# Patient Record
Sex: Female | Born: 1990 | Race: White | Hispanic: No | Marital: Single | Smoking: Former smoker
Health system: Southern US, Community
[De-identification: ages and names within clinical notes are randomized; demographics above are authoritative.]

## PROBLEM LIST (undated history)

## (undated) DIAGNOSIS — C801 Malignant (primary) neoplasm, unspecified: Secondary | ICD-10-CM

## (undated) DIAGNOSIS — E559 Vitamin D deficiency, unspecified: Secondary | ICD-10-CM

## (undated) DIAGNOSIS — I10 Essential (primary) hypertension: Secondary | ICD-10-CM

## (undated) DIAGNOSIS — M797 Fibromyalgia: Secondary | ICD-10-CM

## (undated) DIAGNOSIS — G43909 Migraine, unspecified, not intractable, without status migrainosus: Secondary | ICD-10-CM

## (undated) DIAGNOSIS — F419 Anxiety disorder, unspecified: Secondary | ICD-10-CM

## (undated) DIAGNOSIS — IMO0002 Reserved for concepts with insufficient information to code with codable children: Secondary | ICD-10-CM

## (undated) HISTORY — PX: APPENDECTOMY: SHX54

## (undated) HISTORY — PX: CHOLECYSTECTOMY: SHX55

---

## 2013-11-19 ENCOUNTER — Encounter (HOSPITAL_COMMUNITY): Payer: Self-pay | Admitting: Emergency Medicine

## 2013-11-19 ENCOUNTER — Emergency Department (HOSPITAL_COMMUNITY)
Admission: EM | Admit: 2013-11-19 | Discharge: 2013-11-20 | Disposition: A | Discharge status: Home / Self Care | Payer: Self-pay | Attending: Emergency Medicine | Admitting: Emergency Medicine

## 2013-11-19 DIAGNOSIS — Z3202 Encounter for pregnancy test, result negative: Secondary | ICD-10-CM | POA: Insufficient documentation

## 2013-11-19 DIAGNOSIS — R102 Pelvic and perineal pain: Secondary | ICD-10-CM

## 2013-11-19 DIAGNOSIS — N949 Unspecified condition associated with female genital organs and menstrual cycle: Secondary | ICD-10-CM | POA: Insufficient documentation

## 2013-11-19 DIAGNOSIS — Z880 Allergy status to penicillin: Secondary | ICD-10-CM | POA: Insufficient documentation

## 2013-11-19 DIAGNOSIS — F17200 Nicotine dependence, unspecified, uncomplicated: Secondary | ICD-10-CM | POA: Insufficient documentation

## 2013-11-19 DIAGNOSIS — Z8639 Personal history of other endocrine, nutritional and metabolic disease: Secondary | ICD-10-CM | POA: Insufficient documentation

## 2013-11-19 DIAGNOSIS — Z862 Personal history of diseases of the blood and blood-forming organs and certain disorders involving the immune mechanism: Secondary | ICD-10-CM | POA: Insufficient documentation

## 2013-11-19 DIAGNOSIS — R109 Unspecified abdominal pain: Secondary | ICD-10-CM | POA: Insufficient documentation

## 2013-11-19 DIAGNOSIS — Z8739 Personal history of other diseases of the musculoskeletal system and connective tissue: Secondary | ICD-10-CM | POA: Insufficient documentation

## 2013-11-19 DIAGNOSIS — N898 Other specified noninflammatory disorders of vagina: Secondary | ICD-10-CM | POA: Insufficient documentation

## 2013-11-19 HISTORY — DX: Reserved for concepts with insufficient information to code with codable children: IMO0002

## 2013-11-19 HISTORY — DX: Vitamin D deficiency, unspecified: E55.9

## 2013-11-19 HISTORY — DX: Fibromyalgia: M79.7

## 2013-11-19 LAB — WET PREP, GENITAL
CLUE CELLS WET PREP: NONE SEEN
Trich, Wet Prep: NONE SEEN
Yeast Wet Prep HPF POC: NONE SEEN

## 2013-11-19 LAB — URINALYSIS, ROUTINE W REFLEX MICROSCOPIC
BILIRUBIN URINE: NEGATIVE
Glucose, UA: NEGATIVE mg/dL
Hgb urine dipstick: NEGATIVE
KETONES UR: NEGATIVE mg/dL
LEUKOCYTES UA: NEGATIVE
Nitrite: NEGATIVE
Protein, ur: NEGATIVE mg/dL
Specific Gravity, Urine: 1.02 (ref 1.005–1.030)
UROBILINOGEN UA: 0.2 mg/dL (ref 0.0–1.0)
pH: 5.5 (ref 5.0–8.0)

## 2013-11-19 LAB — POCT PREGNANCY, URINE: Preg Test, Ur: NEGATIVE

## 2013-11-19 MED ORDER — FENTANYL CITRATE 0.05 MG/ML IJ SOLN: 50.0000 ug | Freq: Once | INTRAMUSCULAR | Status: DC

## 2013-11-19 MED ORDER — FENTANYL CITRATE 0.05 MG/ML IJ SOLN
25.0000 ug | Freq: Once | INTRAMUSCULAR | Status: AC
  Administered 2013-11-19: 25 ug via INTRAMUSCULAR
  Filled 2013-11-19: qty 2

## 2013-11-19 NOTE — ED Provider Notes (Signed)
CSN: [id number]     Arrival date & time 11/19/13  1716 History   First MD Initiated Contact with Patient 11/19/13 2145     Chief Complaint  Patient presents with  . Pelvic Pain   (Consider location/radiation/quality/duration/timing/severity/associated sxs/prior Treatment) Patient is a 23 y.o. female presenting with vaginal discharge. The history is provided by the patient.  Vaginal Discharge Quality:  Milky Severity:  Moderate Onset quality:  Gradual Timing:  Constant Relieved by:  Nothing Associated symptoms: abdominal pain   Associated symptoms: no dyspareunia, no dysuria, no fever, no nausea and no vomiting   Associated symptoms comment:  Pelvic pain   Past Medical History  Diagnosis Date  . Herniated disc   . Fibromyalgia   . Vitamin D deficiency    History reviewed. No pertinent past surgical history. History reviewed. No pertinent family history. History  Substance Use Topics  . Smoking status: Current Every Day Smoker    Types: Cigarettes  . Smokeless tobacco: Not on file  . Alcohol Use: Yes     Comment: occ   OB History   Grav Para Term Preterm Abortions TAB SAB Ect Mult Living                 Review of Systems  Constitutional: Negative for fever and chills.  HENT: Negative for sore throat.   Eyes: Negative for pain.  Respiratory: Negative for cough and shortness of breath.   Cardiovascular: Negative for chest pain.  Gastrointestinal: Positive for abdominal pain. Negative for nausea, vomiting and diarrhea.  Genitourinary: Positive for vaginal discharge and vaginal pain. Negative for dysuria and dyspareunia.  Musculoskeletal: Negative for back pain.  Skin: Negative for rash.  Neurological: Negative for numbness and headaches.    Allergies  Penicillins and Morphine and related  Home Medications   Current Outpatient Rx  Name  Route  Sig  Dispense  Refill  . aspirin-acetaminophen-caffeine (EXCEDRIN MIGRAINE) 250-250-65 MG per tablet   Oral   Take 2  tablets by mouth every 6 (six) hours as needed for headache.         . Pseudoeph-CPM-DM-APAP (TYLENOL COLD) 30-2-15-325 MG TABS   Oral   Take 2 tablets by mouth daily as needed (for cold).         Marland Kitchen HYDROcodone-acetaminophen (NORCO/VICODIN) 5-325 MG per tablet   Oral   Take 1-2 tablets by mouth every 4 (four) hours as needed for moderate pain or severe pain.   15 tablet   0    BP 109/77  Pulse 70  Temp(Src) 98.3 F (36.8 C) (Oral)  Resp 18  SpO2 99%  LMP 11/03/2013 Physical Exam  Constitutional: She is oriented to person, place, and time. She appears well-developed and well-nourished. No distress.  HENT:  Head: Normocephalic and atraumatic.  Eyes: Pupils are equal, round, and reactive to light. Right eye exhibits no discharge. Left eye exhibits no discharge.  Neck: Normal range of motion.  Cardiovascular: Normal rate, regular rhythm and normal heart sounds.   Pulmonary/Chest: Effort normal and breath sounds normal.  Abdominal: Soft. She exhibits no distension. There is no tenderness.  Genitourinary: There is no rash or tenderness on the right labia. There is no rash or tenderness on the left labia. Cervix exhibits friability. Cervix exhibits no motion tenderness and no discharge. Right adnexum displays no mass and no tenderness. Left adnexum displays no mass and no tenderness. There is tenderness around the vagina.  Musculoskeletal: Normal range of motion.  Neurological: She is alert and oriented  to person, place, and time.  Skin: Skin is warm. She is not diaphoretic.   ED Course  Procedures (including critical care time) Labs Review Labs Reviewed  WET PREP, GENITAL - Abnormal; Notable for the following:    WBC, Wet Prep HPF POC MANY (*)    All other components within normal limits  GC/CHLAMYDIA PROBE AMP  URINALYSIS, ROUTINE W REFLEX MICROSCOPIC  POCT PREGNANCY, URINE   Imaging Review No results found.  EKG Interpretation   None       MDM   1. Pelvic pain     23 yo F with pelvic pain for a few months, seen previously for the same. Worsening over the last few weeks.   Upon examination, patient HDS. PE as above, with mild whitish discharge, no CMT tenderness, mild friability. Doubt PID, TOA. Likely discharge with follow-up with OB/GYN for [place]. Not indicated at present time. Will send wet prep, GC/Chlam.   No acute findings on lab work. Patient not pregnant. HDS. Will d/c to home with short course of breakthrough pain medications, with [facility]. Patient voices understanding, and is comfortable with that plan. Strict return precautions given. Patient seen and evaluated by myself and my attending, Dr. Karle Starch.      Freddi Che, MD 11/20/13 [phone]

## 2013-11-19 NOTE — ED Notes (Signed)
Reports having pelvic pain x a few months, has been seen for same. Has been more severe x 1.5 weeks. Having pressure and pain with urination. Reports heavier vaginal discharge than her norm. No acute distress noted at triage.

## 2013-11-20 MED ORDER — HYDROCODONE-ACETAMINOPHEN 5-325 MG PO TABS: 1.0000 | ORAL_TABLET | ORAL | Status: DC | PRN

## 2013-11-20 NOTE — ED Provider Notes (Signed)
I saw and evaluated the patient, reviewed the resident's note and I agree with the findings and plan.  EKG Interpretation   None       Pt with weeks of pelvic pain, not pregnant. Labs reviewed, will need Gyn followup.   Peniel Biel B. Karle Starch, MD 11/20/13 1610

## 2013-11-21 LAB — GC/CHLAMYDIA PROBE AMP
CT Probe RNA: NEGATIVE
GC PROBE AMP APTIMA: NEGATIVE

## 2015-08-07 ENCOUNTER — Encounter (HOSPITAL_COMMUNITY): Payer: Self-pay | Admitting: *Deleted

## 2015-08-07 ENCOUNTER — Emergency Department (HOSPITAL_COMMUNITY): Payer: Medicaid Other

## 2015-08-07 ENCOUNTER — Emergency Department (HOSPITAL_COMMUNITY)
Admission: EM | Admit: 2015-08-07 | Discharge: 2015-08-08 | Disposition: A | Discharge status: Home / Self Care | Payer: Medicaid Other | Attending: Emergency Medicine | Admitting: Emergency Medicine

## 2015-08-07 DIAGNOSIS — N390 Urinary tract infection, site not specified: Secondary | ICD-10-CM | POA: Diagnosis not present

## 2015-08-07 DIAGNOSIS — Z3202 Encounter for pregnancy test, result negative: Secondary | ICD-10-CM | POA: Insufficient documentation

## 2015-08-07 DIAGNOSIS — Z720 Tobacco use: Secondary | ICD-10-CM | POA: Insufficient documentation

## 2015-08-07 DIAGNOSIS — N739 Female pelvic inflammatory disease, unspecified: Secondary | ICD-10-CM | POA: Insufficient documentation

## 2015-08-07 DIAGNOSIS — Z880 Allergy status to penicillin: Secondary | ICD-10-CM | POA: Insufficient documentation

## 2015-08-07 DIAGNOSIS — Z79899 Other long term (current) drug therapy: Secondary | ICD-10-CM | POA: Insufficient documentation

## 2015-08-07 DIAGNOSIS — Z8739 Personal history of other diseases of the musculoskeletal system and connective tissue: Secondary | ICD-10-CM | POA: Insufficient documentation

## 2015-08-07 DIAGNOSIS — A5901 Trichomonal vulvovaginitis: Secondary | ICD-10-CM | POA: Diagnosis not present

## 2015-08-07 DIAGNOSIS — R102 Pelvic and perineal pain unspecified side: Secondary | ICD-10-CM

## 2015-08-07 DIAGNOSIS — N938 Other specified abnormal uterine and vaginal bleeding: Secondary | ICD-10-CM | POA: Diagnosis present

## 2015-08-07 DIAGNOSIS — N730 Acute parametritis and pelvic cellulitis: Secondary | ICD-10-CM

## 2015-08-07 DIAGNOSIS — A599 Trichomoniasis, unspecified: Secondary | ICD-10-CM

## 2015-08-07 LAB — WET PREP, GENITAL: YEAST WET PREP: NONE SEEN

## 2015-08-07 LAB — URINALYSIS, ROUTINE W REFLEX MICROSCOPIC
Bilirubin Urine: NEGATIVE
Glucose, UA: NEGATIVE mg/dL
Hgb urine dipstick: NEGATIVE
KETONES UR: NEGATIVE mg/dL
NITRITE: NEGATIVE
Protein, ur: NEGATIVE mg/dL
Specific Gravity, Urine: 1.026 (ref 1.005–1.030)
Urobilinogen, UA: 0.2 mg/dL (ref 0.0–1.0)
pH: 7 (ref 5.0–8.0)

## 2015-08-07 LAB — I-STAT CHEM 8, ED
BUN: 23 mg/dL — ABNORMAL HIGH (ref 6–20)
CALCIUM ION: 1.17 mmol/L (ref 1.12–1.23)
Chloride: 106 mmol/L (ref 101–111)
Creatinine, Ser: 0.6 mg/dL (ref 0.44–1.00)
Glucose, Bld: 110 mg/dL — ABNORMAL HIGH (ref 65–99)
HCT: 48 % — ABNORMAL HIGH (ref 36.0–46.0)
Hemoglobin: 16.3 g/dL — ABNORMAL HIGH (ref 12.0–15.0)
Potassium: 4.3 mmol/L (ref 3.5–5.1)
SODIUM: 141 mmol/L (ref 135–145)
TCO2: 23 mmol/L (ref 0–100)

## 2015-08-07 LAB — URINE MICROSCOPIC-ADD ON

## 2015-08-07 LAB — POC URINE PREG, ED: PREG TEST UR: NEGATIVE

## 2015-08-07 MED ORDER — SODIUM CHLORIDE 0.9 % IV SOLN
INTRAVENOUS | Status: DC
  Administered 2015-08-07: 21:00:00 via INTRAVENOUS

## 2015-08-07 MED ORDER — AZITHROMYCIN 250 MG PO TABS
1000.0000 mg | ORAL_TABLET | Freq: Once | ORAL | Status: AC
  Administered 2015-08-07: 1000 mg via ORAL
  Filled 2015-08-07: qty 4

## 2015-08-07 MED ORDER — KETOROLAC TROMETHAMINE 30 MG/ML IJ SOLN
30.0000 mg | Freq: Once | INTRAMUSCULAR | Status: AC
  Administered 2015-08-07: 30 mg via INTRAVENOUS
  Filled 2015-08-07: qty 1

## 2015-08-07 MED ORDER — DEXTROSE 5 % IV SOLN
1.0000 g | Freq: Once | INTRAVENOUS | Status: AC
  Administered 2015-08-07: 1 g via INTRAVENOUS
  Filled 2015-08-07: qty 10

## 2015-08-07 MED ORDER — PROMETHAZINE HCL 25 MG/ML IJ SOLN
12.5000 mg | Freq: Once | INTRAMUSCULAR | Status: AC
  Administered 2015-08-07: 12.5 mg via INTRAVENOUS
  Filled 2015-08-07: qty 1

## 2015-08-07 MED ORDER — HYDROMORPHONE HCL 1 MG/ML IJ SOLN
1.0000 mg | Freq: Once | INTRAMUSCULAR | Status: AC
  Administered 2015-08-07: 1 mg via INTRAVENOUS
  Filled 2015-08-07: qty 1

## 2015-08-07 MED ORDER — NAPROXEN 500 MG PO TABS: 500.0000 mg | ORAL_TABLET | Freq: Two times a day (BID) | ORAL | Status: DC

## 2015-08-07 NOTE — ED Notes (Signed)
Patient reports history of cervical cancer, has not begun any treatments due to lack of insurance. Patient reports vaginal bleeding, abdominal pain, and episodes of passing out.

## 2015-08-07 NOTE — ED Notes (Signed)
Pt st's no relief from Toradol.

## 2015-08-07 NOTE — ED Provider Notes (Signed)
CSN: [id number]     Arrival date & time 08/07/15  1752 History  By signing my name below, I, Gina Lawson, attest that this documentation has been prepared under the direction and in the presence of Gina Baller, NP.  Electronically Signed: Tula Lawson, ED Scribe. 08/07/2015. 7:46 PM.   Chief Complaint  Patient presents with  . Vaginal Bleeding   The history is provided by the patient. No language interpreter was used.   HPI Comments: Gina Lawson is a 24 y.o. female G4 P1 with cervical cancer and a history of Vitamin D deficiency and fibromyalgia who presents to the Emergency Department complaining of intermittent, gradually worsening, moderate vaginal bleeding that started several months ago. Pt reports multiple episodes of syncope due to pain, vomiting, abdominal pain and dyspareunia as associated symptoms. She also notes recurrent kidney infections over the last year. Pt was diagnosed with precancerous cervical cells 5 years ago and was being monitored every 6 months prior to losing insurance. She states that she was last seen in the ED 6 months ago and was told her cancerous cells were spreading. Pt is currently waiting for Medicaid in order to pursue cancer treatment. She last had intercourse 3 days ago. Pt denies fever.   Past Medical History  Diagnosis Date  . Herniated disc   . Fibromyalgia   . Vitamin D deficiency    History reviewed. No pertinent past surgical history. History reviewed. No pertinent family history. Social History  Substance Use Topics  . Smoking status: Current Every Day Smoker    Types: Cigarettes  . Smokeless tobacco: None  . Alcohol Use: Yes     Comment: occ   OB History    No data available     Review of Systems  Constitutional: Negative for fever.  Gastrointestinal: Positive for vomiting and abdominal pain.  Genitourinary: Positive for vaginal bleeding.  Neurological: Positive for syncope.  All other systems reviewed and are  negative.     Allergies  Penicillins and Morphine and related  Home Medications   Prior to Admission medications   Medication Sig Start Date End Date Taking? Authorizing Provider  norgestimate-ethinyl estradiol (SPRINTEC 28) 0.25-35 MG-MCG tablet Take 1 tablet by mouth daily. 01/30/14  Yes Historical Provider, MD  metroNIDAZOLE (FLAGYL) 500 MG tablet Take 1 tablet (500 mg total) by mouth 2 (two) times daily. 08/08/15   Hope Bunnie Pion, NP  naproxen (NAPROSYN) 500 MG tablet Take 1 tablet (500 mg total) by mouth 2 (two) times daily. 08/07/15   Hope Bunnie Pion, NP  ondansetron (ZOFRAN) 4 MG tablet Take 1 tablet (4 mg total) by mouth every 6 (six) hours. 08/08/15   Hope Bunnie Pion, NP   BP 102/51 mmHg  Pulse 64  Temp(Src) 98.1 F (36.7 C) (Oral)  Resp 16  SpO2 99% Physical Exam  Constitutional: She is oriented to person, place, and time. She appears well-developed and well-nourished.  HENT:  Head: Normocephalic and atraumatic.  Eyes: EOM are normal.  Neck: Normal range of motion. Neck supple.  Cardiovascular: Normal rate.   Pulmonary/Chest: Effort normal.  Abdominal: Soft. There is tenderness in the right lower quadrant, suprapubic area and left lower quadrant. There is no rebound, no guarding and no CVA tenderness.  Genitourinary:  External genitalia without lesions, frothy d/c vaginal vault Cervix without lesions, positive CMT, bilateral adnexal tenderness, uterus without palpable enlargement.   Musculoskeletal: Normal range of motion.  Neurological: She is alert and oriented to person, place, and time. She has normal  strength. No cranial nerve deficit. Gait normal.  Skin: Skin is warm and dry.  Psychiatric: She has a normal mood and affect. Her behavior is normal.  Nursing note and vitals reviewed.   ED Course  Procedures   DIAGNOSTIC STUDIES: Oxygen Saturation is 97% on RA, normal by my interpretation.    COORDINATION OF CARE: 7:59 PM Discussed treatment plan with pt which  includes IV fluids, pain and nausea management, US Pelvis and a pelvic exam. Pt agreed to plan.  Consult with Dr. Elly Modena, OB/GYN at [facility] and patient can follow up there as needed.   Labs Review Results for orders placed or performed during the hospital encounter of 08/07/15 (from the past 24 hour(s))  I-Stat Chem 8, ED     Status: Abnormal   Collection Time: 08/07/15  6:30 PM  Result Value Ref Range   Sodium 141 135 - 145 mmol/L   Potassium 4.3 3.5 - 5.1 mmol/L   Chloride 106 101 - 111 mmol/L   BUN 23 (H) 6 - 20 mg/dL   Creatinine, Ser 0.60 0.44 - 1.00 mg/dL   Glucose, Bld 110 (H) 65 - 99 mg/dL   Calcium, Ion 1.17 1.12 - 1.23 mmol/L   TCO2 23 0 - 100 mmol/L   Hemoglobin 16.3 (H) 12.0 - 15.0 g/dL   HCT 48.0 (H) 36.0 - 46.0 %  POC urine preg, ED (not at [facility])     Status: None   Collection Time: 08/07/15  8:47 PM  Result Value Ref Range   Preg Test, Ur NEGATIVE NEGATIVE  Urinalysis, Routine w reflex microscopic (not at [facility])     Status: Abnormal   Collection Time: 08/07/15  8:51 PM  Result Value Ref Range   Color, Urine YELLOW YELLOW   APPearance CLOUDY (A) CLEAR   Specific Gravity, Urine 1.026 1.005 - 1.030   pH 7.0 5.0 - 8.0   Glucose, UA NEGATIVE NEGATIVE mg/dL   Hgb urine dipstick NEGATIVE NEGATIVE   Bilirubin Urine NEGATIVE NEGATIVE   Ketones, ur NEGATIVE NEGATIVE mg/dL   Protein, ur NEGATIVE NEGATIVE mg/dL   Urobilinogen, UA 0.2 0.0 - 1.0 mg/dL   Nitrite NEGATIVE NEGATIVE   Leukocytes, UA MODERATE (A) NEGATIVE  Urine microscopic-add on     Status: Abnormal   Collection Time: 08/07/15  8:51 PM  Result Value Ref Range   Squamous Epithelial / LPF MANY (A) RARE   WBC, UA 11-20 <3 WBC/hpf   RBC / HPF 0-2 <3 RBC/hpf   Bacteria, UA MANY (A) RARE   Casts HYALINE CASTS (A) NEGATIVE   Urine-Other MUCOUS PRESENT   Wet prep, genital     Status: Abnormal   Collection Time: 08/07/15 10:10 PM  Result Value Ref Range   Yeast Wet Prep HPF POC NONE SEEN NONE SEEN    Trich, Wet Prep FEW (A) NONE SEEN   Clue Cells Wet Prep HPF POC MODERATE (A) NONE SEEN   WBC, Wet Prep HPF POC FEW (A) NONE SEEN    Rocephin 1 gram IV, Zithromax 1 gram PO  Imaging Review US Transvaginal Non-ob  08/07/2015   CLINICAL DATA:  Pelvic plain and bleeding.  EXAM: TRANSABDOMINAL AND TRANSVAGINAL ULTRASOUND OF PELVIS  TECHNIQUE: Both transabdominal and transvaginal ultrasound examinations of the pelvis were performed. Transabdominal technique was performed for global imaging of the pelvis including uterus, ovaries, adnexal regions, and pelvic cul-de-sac. It was necessary to proceed with endovaginal exam following the transabdominal exam to visualize the endometrium, bilateral ovaries, and uterus.  COMPARISON:  None  FINDINGS: Uterus  Measurements: 6.8 x 3.6 x 4.6 cm. No fibroids or other mass visualized.  Endometrium  Thickness: 7.8 mm.  No focal abnormality visualized.  Right ovary  Measurements: 2.8 x 1.5 x 2.7 cm. Normal appearance/no adnexal mass.  Left ovary  Measurements: 2.7 x 1.9 x 2.8 cm. Normal appearance/no adnexal mass.  Other findings  No free fluid.  IMPRESSION: Normal pelvic ultrasound.   Electronically Signed   By: Abelardo Diesel M.D.   On: 08/07/2015 22:07   US Pelvis Complete  08/07/2015   CLINICAL DATA:  Pelvic plain and bleeding.  EXAM: TRANSABDOMINAL AND TRANSVAGINAL ULTRASOUND OF PELVIS  TECHNIQUE: Both transabdominal and transvaginal ultrasound examinations of the pelvis were performed. Transabdominal technique was performed for global imaging of the pelvis including uterus, ovaries, adnexal regions, and pelvic cul-de-sac. It was necessary to proceed with endovaginal exam following the transabdominal exam to visualize the endometrium, bilateral ovaries, and uterus.  COMPARISON:  None  FINDINGS: Uterus  Measurements: 6.8 x 3.6 x 4.6 cm. No fibroids or other mass visualized.  Endometrium  Thickness: 7.8 mm.  No focal abnormality visualized.  Right ovary  Measurements: 2.8 x 1.5  x 2.7 cm. Normal appearance/no adnexal mass.  Left ovary  Measurements: 2.7 x 1.9 x 2.8 cm. Normal appearance/no adnexal mass.  Other findings  No free fluid.  IMPRESSION: Normal pelvic ultrasound.   Electronically Signed   By: Abelardo Diesel M.D.   On: 08/07/2015 22:07    MDM  24 y.o. female with hx of fibromyalgia and panic attacks here tonight with pelvic pain and abnormal vaginal bleeding. Stable for d/c with normal ultrasound and does not appear toxic. No vaginal bleeding on exam. Discussed with the patient clinical, lab and ultrasound findings and all questioned fully answered. She  Is to call for a follow up GYN appointment. Discussed need for partner treatment of trichomonas.    Final diagnoses:  Pelvic pain in female  UTI (lower urinary tract infection)  Trichomonas infection  PID (acute pelvic inflammatory disease)    I personally performed the services described in this documentation, which was scribed in my presence. The recorded information has been reviewed and is accurate.    [facility] Bunnie Pion, NP 08/08/15 [place], DO 08/08/15 0315

## 2015-08-07 NOTE — ED Notes (Signed)
Pelvic exam performed by Delano Metz - NP and Dorethea Clan - EMT assisted.

## 2015-08-07 NOTE — ED Notes (Signed)
Pt to ultrasound at this time.

## 2015-08-08 LAB — GC/CHLAMYDIA PROBE AMP (~~LOC~~) NOT AT ARMC
CHLAMYDIA, DNA PROBE: NEGATIVE
Neisseria Gonorrhea: NEGATIVE

## 2015-08-08 LAB — HIV ANTIBODY (ROUTINE TESTING W REFLEX): HIV Screen 4th Generation wRfx: NONREACTIVE

## 2015-08-08 LAB — RPR: RPR Ser Ql: NONREACTIVE

## 2015-08-08 MED ORDER — METRONIDAZOLE 500 MG PO TABS: 500.0000 mg | ORAL_TABLET | Freq: Two times a day (BID) | ORAL | Status: DC

## 2015-08-08 MED ORDER — ONDANSETRON HCL 4 MG PO TABS: 4.0000 mg | ORAL_TABLET | Freq: Four times a day (QID) | ORAL | Status: DC

## 2015-08-08 NOTE — Discharge Instructions (Signed)
Your ultrasound tonight was normal.   Your wet prep show that you have a trichomonas infection and bacterial vaginosis. This can cause abnormal bleeding. Be sure your sex partner is treated and no sex for one week after you are both treated. Men usually do not have symptoms and will re infect you if he is not treated.   Stop the Excedrin an and take the pain medication we give you. Call for a follow up appointment with [facility] and they will help you with the GYN visit. Or you can call the GYN office to schedule an appointment.

## 2015-08-10 LAB — URINE CULTURE

## 2015-08-30 ENCOUNTER — Encounter: Payer: Self-pay | Admitting: Obstetrics and Gynecology

## 2016-04-14 ENCOUNTER — Encounter (HOSPITAL_COMMUNITY): Payer: Self-pay | Admitting: Emergency Medicine

## 2016-04-14 DIAGNOSIS — B9689 Other specified bacterial agents as the cause of diseases classified elsewhere: Secondary | ICD-10-CM | POA: Diagnosis not present

## 2016-04-14 DIAGNOSIS — N760 Acute vaginitis: Secondary | ICD-10-CM | POA: Insufficient documentation

## 2016-04-14 DIAGNOSIS — N739 Female pelvic inflammatory disease, unspecified: Secondary | ICD-10-CM | POA: Diagnosis not present

## 2016-04-14 DIAGNOSIS — R102 Pelvic and perineal pain: Secondary | ICD-10-CM | POA: Diagnosis present

## 2016-04-14 LAB — URINALYSIS, ROUTINE W REFLEX MICROSCOPIC
Bilirubin Urine: NEGATIVE
Glucose, UA: NEGATIVE mg/dL
Hgb urine dipstick: NEGATIVE
Ketones, ur: NEGATIVE mg/dL
LEUKOCYTES UA: NEGATIVE
Nitrite: NEGATIVE
PROTEIN: NEGATIVE mg/dL
Specific Gravity, Urine: 1.021 (ref 1.005–1.030)
pH: 6.5 (ref 5.0–8.0)

## 2016-04-14 LAB — POC URINE PREG, ED: PREG TEST UR: NEGATIVE

## 2016-04-14 NOTE — ED Notes (Signed)
Patient has been having pelvic pain for the last few days.  Patient states she has been having pressure in her pelvis and has been having clear discharge.  Patient states that she has been diagnosed with cervical cancer.  Patient denies any vaginal bleeding.  Patient concerned and feeling anxious.

## 2016-04-15 ENCOUNTER — Emergency Department (HOSPITAL_COMMUNITY): Payer: Medicaid Other

## 2016-04-15 ENCOUNTER — Emergency Department (HOSPITAL_COMMUNITY)
Admission: EM | Admit: 2016-04-15 | Discharge: 2016-04-15 | Disposition: A | Discharge status: Home / Self Care | Payer: Medicaid Other | Attending: Emergency Medicine | Admitting: Emergency Medicine

## 2016-04-15 DIAGNOSIS — R109 Unspecified abdominal pain: Secondary | ICD-10-CM

## 2016-04-15 DIAGNOSIS — N730 Acute parametritis and pelvic cellulitis: Secondary | ICD-10-CM

## 2016-04-15 DIAGNOSIS — B9689 Other specified bacterial agents as the cause of diseases classified elsewhere: Secondary | ICD-10-CM

## 2016-04-15 DIAGNOSIS — N760 Acute vaginitis: Secondary | ICD-10-CM

## 2016-04-15 DIAGNOSIS — R102 Pelvic and perineal pain: Secondary | ICD-10-CM

## 2016-04-15 LAB — CBC WITH DIFFERENTIAL/PLATELET
BASOS PCT: 0 %
Basophils Absolute: 0 10*3/uL (ref 0.0–0.1)
EOS ABS: 0.3 10*3/uL (ref 0.0–0.7)
EOS PCT: 3 %
HCT: 39.7 % (ref 36.0–46.0)
Hemoglobin: 12.9 g/dL (ref 12.0–15.0)
LYMPHS ABS: 4.9 10*3/uL — AB (ref 0.7–4.0)
Lymphocytes Relative: 46 %
MCH: 29.2 pg (ref 26.0–34.0)
MCHC: 32.5 g/dL (ref 30.0–36.0)
MCV: 89.8 fL (ref 78.0–100.0)
Monocytes Absolute: 0.6 10*3/uL (ref 0.1–1.0)
Monocytes Relative: 6 %
Neutro Abs: 4.8 10*3/uL (ref 1.7–7.7)
Neutrophils Relative %: 45 %
PLATELETS: 263 10*3/uL (ref 150–400)
RBC: 4.42 MIL/uL (ref 3.87–5.11)
RDW: 13.4 % (ref 11.5–15.5)
WBC: 10.6 10*3/uL — AB (ref 4.0–10.5)

## 2016-04-15 LAB — BASIC METABOLIC PANEL
Anion gap: 6 (ref 5–15)
BUN: 18 mg/dL (ref 6–20)
CALCIUM: 9.6 mg/dL (ref 8.9–10.3)
CO2: 25 mmol/L (ref 22–32)
CREATININE: 0.64 mg/dL (ref 0.44–1.00)
Chloride: 106 mmol/L (ref 101–111)
GFR calc non Af Amer: >60 mL/min (ref >60)
GLUCOSE: 94 mg/dL (ref 65–99)
Potassium: 4.9 mmol/L (ref 3.5–5.1)
Sodium: 137 mmol/L (ref 135–145)

## 2016-04-15 LAB — WET PREP, GENITAL
Sperm: NONE SEEN
Trich, Wet Prep: NONE SEEN
YEAST WET PREP: NONE SEEN

## 2016-04-15 LAB — HIV ANTIBODY (ROUTINE TESTING W REFLEX): HIV SCREEN 4TH GENERATION: NONREACTIVE

## 2016-04-15 LAB — RPR: RPR Ser Ql: NONREACTIVE

## 2016-04-15 MED ORDER — ONDANSETRON HCL 4 MG/2ML IJ SOLN
4.0000 mg | Freq: Once | INTRAMUSCULAR | Status: AC
  Administered 2016-04-15: 4 mg via INTRAVENOUS
  Filled 2016-04-15: qty 2

## 2016-04-15 MED ORDER — METRONIDAZOLE 500 MG PO TABS: 500.0000 mg | ORAL_TABLET | Freq: Two times a day (BID) | ORAL | Status: DC

## 2016-04-15 MED ORDER — SODIUM CHLORIDE 0.9 % IV BOLUS (SEPSIS)
1000.0000 mL | Freq: Once | INTRAVENOUS | Status: AC
  Administered 2016-04-15: 1000 mL via INTRAVENOUS

## 2016-04-15 MED ORDER — METRONIDAZOLE 500 MG PO TABS
500.0000 mg | ORAL_TABLET | Freq: Once | ORAL | Status: AC
  Administered 2016-04-15: 500 mg via ORAL
  Filled 2016-04-15: qty 1

## 2016-04-15 MED ORDER — DOXYCYCLINE HYCLATE 100 MG PO TABS
100.0000 mg | ORAL_TABLET | Freq: Once | ORAL | Status: AC
  Administered 2016-04-15: 100 mg via ORAL
  Filled 2016-04-15: qty 1

## 2016-04-15 MED ORDER — DEXTROSE 5 % IV SOLN
1.0000 g | Freq: Once | INTRAVENOUS | Status: AC
  Administered 2016-04-15: 1 g via INTRAVENOUS
  Filled 2016-04-15: qty 10

## 2016-04-15 MED ORDER — HYDROCODONE-ACETAMINOPHEN 5-325 MG PO TABS: 2.0000 | ORAL_TABLET | ORAL | Status: DC | PRN

## 2016-04-15 MED ORDER — ONDANSETRON 4 MG PO TBDP: 4.0000 mg | ORAL_TABLET | Freq: Three times a day (TID) | ORAL | Status: DC | PRN

## 2016-04-15 MED ORDER — DOXYCYCLINE HYCLATE 100 MG PO CAPS: 100.0000 mg | ORAL_CAPSULE | Freq: Two times a day (BID) | ORAL | Status: AC

## 2016-04-15 MED ORDER — FENTANYL CITRATE (PF) 100 MCG/2ML IJ SOLN
50.0000 ug | Freq: Once | INTRAMUSCULAR | Status: AC
  Administered 2016-04-15: 50 ug via INTRAVENOUS
  Filled 2016-04-15: qty 2

## 2016-04-15 NOTE — Discharge Instructions (Signed)
STD testing will have results in 2 days, but you are being treated for STD's and you did test positive for BV - see attached info. Your urine was negative for infection and your ultrasound of your pelvis was normal.  Please follow up with your OB/GYN.   Pelvic Inflammatory Disease Pelvic inflammatory disease (PID) refers to an infection in some or all of the female organs. The infection can be in the uterus, ovaries, fallopian tubes, or the surrounding tissues in the pelvis. PID can cause abdominal or pelvic pain that comes on suddenly (acute pelvic pain). PID is a serious infection because it can lead to lasting (chronic) pelvic pain or the inability to have children (infertility). CAUSES This condition is most often caused by an infection that is spread during sexual contact. However, the infection can also be caused by the normal bacteria that are found in the vaginal tissues if these bacteria travel upward into the reproductive organs. PID can also occur following:  The birth of a baby.  A miscarriage.  An abortion.  Major pelvic surgery.  The use of an intrauterine device (IUD).  A sexual assault. RISK FACTORS This condition is more likely to develop in women who:  Are younger than 27 years of age.  Are sexually active at [facility] age.  Use nonbarrier contraception.  Have multiple sexual partners.  Have sex with someone who has symptoms of an STD (sexually transmitted disease).  Use oral contraception. At times, certain behaviors can also increase the possibility of getting PID, such as:  Using a vaginal douche.  Having an IUD in place. SYMPTOMS Symptoms of this condition include:  Abdominal or pelvic pain.  Fever.  Chills.  Abnormal vaginal discharge.  Abnormal uterine bleeding.  Unusual pain shortly after the end of a menstrual period.  Painful urination.  Pain with sexual intercourse.  Nausea and vomiting. DIAGNOSIS To diagnose this condition, your  health care provider will do a physical exam and take your medical history. A pelvic exam typically reveals great tenderness in the uterus and the surrounding pelvic tissues. You may also have tests, such as:  Lab tests, including a pregnancy test, blood tests, and urine test.  Culture tests of the vagina and cervix to check for an STD.  Ultrasound.  A laparoscopic procedure to look inside the pelvis.  Examining vaginal secretions under a microscope. TREATMENT Treatment for this condition may involve one or more approaches.  Antibiotic medicines may be prescribed to be taken by mouth.  Sexual partners may need to be treated if the infection is caused by an STD.  For more severe cases, hospitalization may be needed to give antibiotics directly into a vein through an IV tube.  Surgery may be needed if other treatments do not help, but this is rare. It may take weeks until you are completely well. If you are diagnosed with PID, you should also be checked for human immunodeficiency virus (HIV). Your health care provider may test you for infection again 3 months after treatment. You should not have unprotected sex. HOME CARE INSTRUCTIONS  Take over-the-counter and prescription medicines only as told by your health care provider.  If you were prescribed an antibiotic medicine, take it as told by your health care provider. Do not stop taking the antibiotic even if you start to feel better.  Do not have sexual intercourse until treatment is completed or as told by your health care provider. If PID is confirmed, your recent sexual partners will need treatment,  especially if you had unprotected sex.  Keep all follow-up visits as told by your health care provider. This is important. SEEK MEDICAL CARE IF:  You have increased or abnormal vaginal discharge.  Your pain does not improve.  You vomit.  You have a fever.  You cannot tolerate your medicines.  Your partner has an STD.  You  have pain when you urinate. SEEK IMMEDIATE MEDICAL CARE IF:  You have increased abdominal or pelvic pain.  You have chills.  Your symptoms are not better in 72 hours even with treatment.   This information is not intended to replace advice given to you by your health care provider. Make sure you discuss any questions you have with your health care provider.   Document Released: 10/27/2005 Document Revised: 07/18/2015 Document Reviewed: 12/04/2014 Elsevier Interactive Patient Education 2016 [employer]  Pelvic Pain, Female Female pelvic pain can be caused by many different things and start from a variety of places. Pelvic pain refers to pain that is located in the lower half of the abdomen and between your hips. The pain may occur over a short period of time (acute) or may be reoccurring (chronic). The cause of pelvic pain may be related to disorders affecting the female reproductive organs (gynecologic), but it may also be related to the bladder, kidney stones, an intestinal complication, or muscle or skeletal problems. Getting help right away for pelvic pain is important, especially if there has been severe, sharp, or a sudden onset of unusual pain. It is also important to get help right away because some types of pelvic pain can be life threatening.  CAUSES  Below are only some of the causes of pelvic pain. The causes of pelvic pain can be in one of several categories.   Gynecologic.  Pelvic inflammatory disease.  Sexually transmitted infection.  Ovarian cyst or a twisted ovarian ligament (ovarian torsion).  Uterine lining that grows outside the uterus (endometriosis).  Fibroids, cysts, or tumors.  Ovulation.  Pregnancy.  Pregnancy that occurs outside the uterus (ectopic pregnancy).  Miscarriage.  Labor.  Abruption of the placenta or ruptured uterus.  Infection.  Uterine infection (endometritis).  Bladder infection.  Diverticulitis.  Miscarriage related to a  uterine infection (septic abortion).  Bladder.  Inflammation of the bladder (cystitis).  Kidney stone(s).  Gastrointestinal.  Constipation.  Diverticulitis.  Neurologic.  Trauma.  Feeling pelvic pain because of mental or emotional causes (psychosomatic).  Cancers of the bowel or pelvis. EVALUATION  Your caregiver will want to take a careful history of your concerns. This includes recent changes in your health, a careful gynecologic history of your periods (menses), and a sexual history. Obtaining your family history and medical history is also important. Your caregiver may suggest a pelvic exam. A pelvic exam will help identify the location and severity of the pain. It also helps in the evaluation of which organ system may be involved. In order to identify the cause of the pelvic pain and be properly treated, your caregiver may order tests. These tests may include:   A pregnancy test.  Pelvic ultrasonography.  An X-ray exam of the abdomen.  A urinalysis or evaluation of vaginal discharge.  Blood tests. HOME CARE INSTRUCTIONS   Only take over-the-counter or prescription medicines for pain, discomfort, or fever as directed by your caregiver.   Rest as directed by your caregiver.   Eat a balanced diet.   Drink enough fluids to make your urine clear or pale yellow, or as directed.  Avoid sexual intercourse if it causes pain.   Apply warm or cold compresses to the lower abdomen depending on which one helps the pain.   Avoid stressful situations.   Keep a journal of your pelvic pain. Write down when it started, where the pain is located, and if there are things that seem to be associated with the pain, such as food or your menstrual cycle.  Follow up with your caregiver as directed.  SEEK MEDICAL CARE IF:  Your medicine does not help your pain.  You have abnormal vaginal discharge. SEEK IMMEDIATE MEDICAL CARE IF:   You have heavy bleeding from the vagina.    Your pelvic pain increases.   You feel light-headed or faint.   You have chills.   You have pain with urination or blood in your urine.   You have uncontrolled diarrhea or vomiting.   You have a fever or persistent symptoms for more than 3 days.  You have a fever and your symptoms suddenly get worse.   You are being physically or sexually abused.   This information is not intended to replace advice given to you by your health care provider. Make sure you discuss any questions you have with your health care provider.   Document Released: 09/23/2004 Document Revised: 07/18/2015 Document Reviewed: 02/16/2012 Elsevier Interactive Patient Education 2016 [employer].  Sexually Transmitted Disease A sexually transmitted disease (STD) is a disease or infection that may be passed (transmitted) from person to person, usually during sexual activity. This may happen by way of saliva, semen, blood, vaginal mucus, or urine. Common STDs include:  Gonorrhea.  Chlamydia.  Syphilis.  HIV and AIDS.  Genital herpes.  Hepatitis B and C.  Trichomonas.  Human papillomavirus (HPV).  Pubic lice.  Scabies.  Mites.  Bacterial vaginosis. WHAT ARE CAUSES OF STDs? An STD may be caused by bacteria, a virus, or parasites. STDs are often transmitted during sexual activity if one person is infected. However, they may also be transmitted through nonsexual means. STDs may be transmitted after:   Sexual intercourse with an infected person.  Sharing sex toys with an infected person.  Sharing needles with an infected person or using unclean piercing or tattoo needles.  Having intimate contact with the genitals, mouth, or rectal areas of an infected person.  Exposure to infected fluids during birth. WHAT ARE THE SIGNS AND SYMPTOMS OF STDs? Different STDs have different symptoms. Some people may not have any symptoms. If symptoms are present, they may include:  Painful or bloody  urination.  Pain in the pelvis, abdomen, vagina, anus, throat, or eyes.  A skin rash, itching, or irritation.  Growths, ulcerations, blisters, or sores in the genital and anal areas.  Abnormal vaginal discharge with or without bad odor.  Penile discharge in men.  Fever.  Pain or bleeding during sexual intercourse.  Swollen glands in the groin area.  Yellow skin and eyes (jaundice). This is seen with hepatitis.  Swollen testicles.  Infertility.  Sores and blisters in the mouth. HOW ARE STDs DIAGNOSED? To make a diagnosis, your health care provider may:  Take a medical history.  Perform a physical exam.  Take a sample of any discharge to examine.  Swab the throat, cervix, opening to the penis, rectum, or vagina for testing.  Test a sample of your first morning urine.  Perform blood tests.  Perform a Pap test, if this applies.  Perform a colposcopy.  Perform a laparoscopy. HOW ARE STDs TREATED? Treatment depends  on the STD. Some STDs may be treated but not cured.  Chlamydia, gonorrhea, trichomonas, and syphilis can be cured with antibiotic medicine.  Genital herpes, hepatitis, and HIV can be treated, but not cured, with prescribed medicines. The medicines lessen symptoms.  Genital warts from HPV can be treated with medicine or by freezing, burning (electrocautery), or surgery. Warts may come back.  HPV cannot be cured with medicine or surgery. However, abnormal areas may be removed from the cervix, vagina, or vulva.  If your diagnosis is confirmed, your recent sexual partners need treatment. This is true even if they are symptom-free or have a negative culture or evaluation. They should not have sex until their health care providers say it is okay.  Your health care provider may test you for infection again 3 months after treatment. HOW CAN I REDUCE MY RISK OF GETTING AN STD? Take these steps to reduce your risk of getting an STD:  Use latex condoms, dental  dams, and water-soluble lubricants during sexual activity. Do not use petroleum jelly or oils.  Avoid having multiple sex partners.  Do not have sex with someone who has other sex partners  Do not have sex with anyone you do not know or who is at high risk for an STD.  Avoid risky sex practices that can break your skin.  Do not have sex if you have open sores on your mouth or skin.  Avoid drinking too much alcohol or taking illegal drugs. Alcohol and drugs can affect your judgment and put you in a vulnerable position.  Avoid engaging in oral and anal sex acts.  Get vaccinated for HPV and hepatitis. If you have not received these vaccines in the past, talk to your health care provider about whether one or both might be right for you.  If you are at risk of being infected with HIV, it is recommended that you take a prescription medicine daily to prevent HIV infection. This is called pre-exposure prophylaxis (PrEP). You are considered at risk if:  You are a man who has sex with other men (MSM).  You are a heterosexual man or woman and are sexually active with more than one partner.  You take drugs by injection.  You are sexually active with a partner who has HIV.  Talk with your health care provider about whether you are at high risk of being infected with HIV. If you choose to begin PrEP, you should first be tested for HIV. You should then be tested every 3 months for as long as you are taking PrEP. WHAT SHOULD I DO IF I THINK I HAVE AN STD?  See your health care provider.  Tell your sexual partner(s). They should be tested and treated for any STDs.  Do not have sex until your health care provider says it is okay. WHEN SHOULD I GET IMMEDIATE MEDICAL CARE? Contact your health care provider right away if:   You have severe abdominal pain.  You are a man and notice swelling or pain in your testicles.  You are a woman and notice swelling or pain in your vagina.   This  information is not intended to replace advice given to you by your health care provider. Make sure you discuss any questions you have with your health care provider.   Document Released: 01/17/2003 Document Revised: 11/17/2014 Document Reviewed: 05/17/2013 Elsevier Interactive Patient Education 2016 Elsevier Inc.  Flank Pain Flank pain refers to pain that is located on the side of the  body between the upper abdomen and the back. The pain may occur over a short period of time (acute) or may be long-term or reoccurring (chronic). It may be mild or severe. Flank pain can be caused by many things. CAUSES  Some of the more common causes of flank pain include:  Muscle strains.   Muscle spasms.   A disease of your spine (vertebral disk disease).   A lung infection (pneumonia).   Fluid around your lungs (pulmonary edema).   A kidney infection.   Kidney stones.   A very painful skin rash caused by the chickenpox virus (shingles).   Gallbladder disease.  [place] care will depend on the cause of your pain. In general,  Rest as directed by your caregiver.  Drink enough fluids to keep your urine clear or pale yellow.  Only take over-the-counter or prescription medicines as directed by your caregiver. Some medicines may help relieve the pain.  Tell your caregiver about any changes in your pain.  Follow up with your caregiver as directed. SEEK IMMEDIATE MEDICAL CARE IF:   Your pain is not controlled with medicine.   You have new or worsening symptoms.  Your pain increases.   You have abdominal pain.   You have shortness of breath.   You have persistent nausea or vomiting.   You have swelling in your abdomen.   You feel faint or pass out.   You have blood in your urine.  You have a fever or persistent symptoms for more than 2-3 days.  You have a fever and your symptoms suddenly get worse. MAKE SURE YOU:   Understand these  instructions.  Will watch your condition.  Will get help right away if you are not doing well or get worse.   This information is not intended to replace advice given to you by your health care provider. Make sure you discuss any questions you have with your health care provider.   Document Released: 12/18/2005 Document Revised: 07/21/2012 Document Reviewed: 06/10/2012 Elsevier Interactive Patient Education 2016 Elsevier Inc.  Bacterial Vaginosis Bacterial vaginosis is a vaginal infection that occurs when the normal balance of bacteria in the vagina is disrupted. It results from an overgrowth of certain bacteria. This is the most common vaginal infection in women of childbearing age. Treatment is important to prevent complications, especially in pregnant women, as it can cause a premature delivery. CAUSES  Bacterial vaginosis is caused by an increase in harmful bacteria that are normally present in smaller amounts in the vagina. Several different kinds of bacteria can cause bacterial vaginosis. However, the reason that the condition develops is not fully understood. RISK FACTORS Certain activities or behaviors can put you at an increased risk of developing bacterial vaginosis, including:  Having a new sex partner or multiple sex partners.  Douching.  Using an intrauterine device (IUD) for contraception. Women do not get bacterial vaginosis from toilet seats, bedding, swimming pools, or contact with objects around them. SIGNS AND SYMPTOMS  Some women with bacterial vaginosis have no signs or symptoms. Common symptoms include:  Grey vaginal discharge.  A fishlike odor with discharge, especially after sexual intercourse.  Itching or burning of the vagina and vulva.  Burning or pain with urination. DIAGNOSIS  Your health care provider will take a medical history and examine the vagina for signs of bacterial vaginosis. A sample of vaginal fluid may be taken. Your health care provider  will look at this sample under a microscope to  check for bacteria and abnormal cells. A vaginal pH test may also be done.  TREATMENT  Bacterial vaginosis may be treated with antibiotic medicines. These may be given in the form of a pill or a vaginal cream. A second round of antibiotics may be prescribed if the condition comes back after treatment. Because bacterial vaginosis increases your risk for sexually transmitted diseases, getting treated can help reduce your risk for chlamydia, gonorrhea, HIV, and herpes. HOME CARE INSTRUCTIONS   Only take over-the-counter or prescription medicines as directed by your health care provider.  If antibiotic medicine was prescribed, take it as directed. Make sure you finish it even if you start to feel better.  Tell all sexual partners that you have a vaginal infection. They should see their health care provider and be treated if they have problems, such as a mild rash or itching.  During treatment, it is important that you follow these instructions:  Avoid sexual activity or use condoms correctly.  Do not douche.  Avoid alcohol as directed by your health care provider.  Avoid breastfeeding as directed by your health care provider. SEEK MEDICAL CARE IF:   Your symptoms are not improving after 3 days of treatment.  You have increased discharge or pain.  You have a fever. MAKE SURE YOU:   Understand these instructions.  Will watch your condition.  Will get help right away if you are not doing well or get worse. FOR MORE INFORMATION  Centers for Disease Control and Prevention, Division of STD Prevention: [employer] American Sexual Health Association (ASHA): www.ashastd.org    This information is not intended to replace advice given to you by your health care provider. Make sure you discuss any questions you have with your health care provider.   Document Released: 10/27/2005 Document Revised: 11/17/2014 Document Reviewed:  06/08/2013 Elsevier Interactive Patient Education [employer].

## 2016-04-16 LAB — GC/CHLAMYDIA PROBE AMP (~~LOC~~) NOT AT ARMC
Chlamydia: NEGATIVE
NEISSERIA GONORRHEA: NEGATIVE

## 2016-06-02 NOTE — ED Provider Notes (Signed)
[facility] DEPT Provider Note   CSN: [id number] Arrival date & time: 04/14/16  2243  First Provider Contact:  First MD Initiated Contact with Patient 04/15/16 [phone]        History   Chief Complaint Chief Complaint  Patient presents with  . Pelvic Pain    HPI Sequena Zeidan is a 25 y.o. female.  HPI   Patient is a 25 year old female presents to the emergency department with pelvic pain, described as cramps, that radiate to her low back, intermittent for the past 2-3 days.  Pt also reports suprapubic abdominal pressure, left flank pain and clear vaginal discharge, w/o odor.  Pain is rated 7/10, constant, cramping in nature, w/o alleviating or aggravating factors.  She states that vaginal secretions have been large amounts and a few times "have spilled out like she peed her pants."  She denies vaginal itching, dysuria, hematuria.  She feels anxious, has mild nausea intermittently.  She denies fevers, vomiting.  Pt reports a history of "cervical cancer," pathology available in chart states pt had PAP with [facility], and was seen at [facility] by OBGYN in 2015 with documented history of chronic pelvic pain, and negative HPV testing.     Past Medical History:  Diagnosis Date  . Cervical cancer ([place])   . Fibromyalgia   . Herniated disc   . Vitamin D deficiency     There are no active problems to display for this patient.   History reviewed. No pertinent surgical history.  OB History    No data available       Home Medications    Prior to Admission medications   Medication Sig Start Date End Date Taking? Authorizing Provider  HYDROcodone-acetaminophen (NORCO/VICODIN) 5-325 MG tablet Take 2 tablets by mouth every 4 (four) hours as needed. 04/15/16   Delsa Grana, PA-C  metroNIDAZOLE (FLAGYL) 500 MG tablet Take 1 tablet (500 mg total) by mouth 2 (two) times daily. 04/15/16   Delsa Grana, PA-C  naproxen (NAPROSYN) 500 MG tablet Take 1 tablet (500 mg total) by mouth 2 (two)  times daily. 08/07/15   Hope Bunnie Pion, NP  norgestimate-ethinyl estradiol (SPRINTEC 28) 0.25-35 MG-MCG tablet Take 1 tablet by mouth daily. 01/30/14   Historical Provider, MD  ondansetron (ZOFRAN ODT) 4 MG disintegrating tablet Take 1 tablet (4 mg total) by mouth every 8 (eight) hours as needed for nausea or vomiting. 04/15/16   Delsa Grana, PA-C  ondansetron (ZOFRAN) 4 MG tablet Take 1 tablet (4 mg total) by mouth every 6 (six) hours. 08/08/15   Hope Bunnie Pion, NP    Family History No family history on file.  Social History Social History  Substance Use Topics  . Smoking status: Current Every Day Smoker    Types: Cigarettes  . Smokeless tobacco: Not on file  . Alcohol use Yes     Comment: occ     Allergies   Penicillins; Amoxicillin; Dilaudid [hydromorphone hcl]; Tramadol; and Morphine and related   Review of Systems Review of Systems  All other systems reviewed and are negative.    Physical Exam Updated Vital Signs BP 109/80   Pulse 66   Temp 98 F (36.7 C) (Oral)   Resp 21   LMP 03/26/2016 (Exact Date)   SpO2 99%   Physical Exam  Constitutional: She is oriented to person, place, and time. She appears well-developed and well-nourished. No distress.  Anxious thin female, NAD  HENT:  Head: Normocephalic and atraumatic.  Nose: Nose normal.  Mouth/Throat:  Oropharynx is clear and moist. No oropharyngeal exudate.  Eyes: Conjunctivae and EOM are normal. Pupils are equal, round, and reactive to light. Right eye exhibits no discharge. Left eye exhibits no discharge. No scleral icterus.  Neck: Normal range of motion. No JVD present. No tracheal deviation present. No thyromegaly present.  Cardiovascular: Normal rate, regular rhythm, normal heart sounds and intact distal pulses.  Exam reveals no gallop and no friction rub.   No murmur heard. Pulmonary/Chest: Effort normal and breath sounds normal. No respiratory distress. She has no wheezes. She has no rales. She exhibits no  tenderness.  Abdominal: Soft. Bowel sounds are normal. She exhibits no distension and no mass. There is tenderness. There is no rebound and no guarding.  Left CVA tenderness, mild suprapubic tenderness w/o guarding.  Genitourinary: Vagina normal. There is no rash, tenderness or injury on the right labia. There is no rash, tenderness or injury on the left labia. Cervix exhibits discharge. Cervix exhibits no motion tenderness and no friability. Right adnexum displays tenderness. Right adnexum displays no mass. Left adnexum displays tenderness. Left adnexum displays no mass.  Musculoskeletal: Normal range of motion. She exhibits no edema or tenderness.  Lymphadenopathy:    She has no cervical adenopathy.       Right: No inguinal adenopathy present.       Left: No inguinal adenopathy present.  Neurological: She is alert and oriented to person, place, and time. She has normal reflexes. She displays normal reflexes. No cranial nerve deficit. She exhibits normal muscle tone. Coordination normal.  Skin: Skin is warm and dry. No rash noted. She is not diaphoretic. No erythema. No pallor.  Psychiatric: She has a normal mood and affect. Her behavior is normal. Judgment and thought content normal.  Nursing note and vitals reviewed.    ED Treatments / Results  Labs (all labs ordered are listed, but only abnormal results are displayed) Labs Reviewed  WET PREP, GENITAL - Abnormal; Notable for the following:       Result Value   Clue Cells Wet Prep HPF POC PRESENT (*)    WBC, Wet Prep HPF POC MANY (*)    All other components within normal limits  URINALYSIS, ROUTINE W REFLEX MICROSCOPIC (NOT AT [facility]) - Abnormal; Notable for the following:    APPearance CLOUDY (*)    All other components within normal limits  CBC WITH DIFFERENTIAL/PLATELET - Abnormal; Notable for the following:    WBC 10.6 (*)    Lymphs Abs 4.9 (*)    All other components within normal limits  RPR  HIV ANTIBODY (ROUTINE TESTING)    BASIC METABOLIC PANEL  POC URINE PREG, ED  GC/CHLAMYDIA PROBE AMP ([place]) NOT AT [facility]    EKG  EKG Interpretation None       Radiology Study Result   CLINICAL DATA:  Left flank pain  EXAM: RENAL / URINARY TRACT ULTRASOUND COMPLETE  COMPARISON:  None.  FINDINGS: Right Kidney:  Length: 10.4 cm. Echogenicity within normal limits. No mass or hydronephrosis visualized.  Left Kidney:  Length: 9.5 cm. Echogenicity within normal limits. No mass or hydronephrosis visualized.  Bladder:  Appears normal for degree of bladder distention.  IMPRESSION: Normal renal ultrasound.   Electronically Signed   By: Monte Fantasia M.D.   On: 04/15/2016 04:35   US Transvaginal Non-OB (Accession [id number]) (Order [id number])  Imaging  Date: 04/15/2016 Department: [facility] Released By: Wilmon Pali Authorizing: Delsa Grana, PA-C  PACS Images  Show images for US Transvaginal Non-OB  Study Result   CLINICAL DATA:  Pelvic pain.  EXAM: TRANSABDOMINAL AND TRANSVAGINAL ULTRASOUND OF PELVIS  TECHNIQUE: Both transabdominal and transvaginal ultrasound examinations of the pelvis were performed. Transabdominal technique was performed for global imaging of the pelvis including uterus, ovaries, adnexal regions, and pelvic cul-de-sac. It was necessary to proceed with endovaginal exam following the transabdominal exam to visualize the ovaries and endometrium.  COMPARISON:  08/07/2015  FINDINGS: Uterus  Measurements: 8 x 4 x 5 cm. No fibroids or other mass visualized.  Endometrium  Thickness: 13 mm.  No focal abnormality visualized.  Right ovary  Measurements: 25 x 12 x 22 mm. Normal appearance/no adnexal mass.  Left ovary  Measurements: 31 x 24 x 33 mm. Normal appearance/no adnexal mass.  Other findings  No abnormal free fluid.  IMPRESSION: Normal pelvic ultrasound.   Electronically  Signed   By: Monte Fantasia M.D.   On: 04/15/2016 04:34     Procedures Procedures (including critical care time)  Medications Ordered in ED Medications  sodium chloride 0.9 % bolus 1,000 mL (0 mLs Intravenous Stopped 04/15/16 0559)  ondansetron (ZOFRAN) injection 4 mg (4 mg Intravenous Given 04/15/16 0516)  fentaNYL (SUBLIMAZE) injection 50 mcg (50 mcg Intravenous Given 04/15/16 0516)  cefTRIAXone (ROCEPHIN) 1 g in dextrose 5 % 50 mL IVPB (0 g Intravenous Stopped 04/15/16 0559)  metroNIDAZOLE (FLAGYL) tablet 500 mg (500 mg Oral Given 04/15/16 0515)  doxycycline (VIBRA-TABS) tablet 100 mg (100 mg Oral Given 04/15/16 0515)     Initial Impression / Assessment and Plan / ED Course  I have reviewed the triage vital signs and the nursing notes.  Pertinent labs & imaging results that were available during my care of the patient were reviewed by me and considered in my medical decision making (see chart for details).  Clinical Course   Pt with pelvic pain, hx of chronic pelvic pain On exam pt had vaginal discharge, suprapubic tenderness, and left flank pain, worked up for PID vs pyelo vs nephrolithiasis  Basic labs obtained were largely unremarkable with mild leukocytosis. Pelvic exam performed with generalized tenderness and vaginal secretion swabbed, with positive wet prep for BV and many white blood cells.  GC and chlamydia testing pending. Urinalysis was cloudy in appearance with negative leukocytes, negative nitrites Renal and pelvic ultrasounds negative Known etiology of left flank pain, will cover for PID given tenderness on exam and vaginal discharge, will also treat for BV with Flagyl.  Patient well-appearing without fever, tolerating PO's, feel she is safe for outpatient treatment. She was encouraged follow-up with her OB/GYN. Patient discharged in good condition with stable vital signs.   Final Clinical Impressions(s) / ED Diagnoses   Final diagnoses:  Left flank pain  PID (acute  pelvic inflammatory disease)  Bacterial vaginosis    New Prescriptions Discharge Medication List as of 04/15/2016  5:46 AM    START taking these medications   Details  doxycycline (VIBRAMYCIN) 100 MG capsule Take 1 capsule (100 mg total) by mouth 2 (two) times daily., Starting 04/15/2016, Until Tue 04/29/16, Print    ondansetron (ZOFRAN ODT) 4 MG disintegrating tablet Take 1 tablet (4 mg total) by mouth every 8 (eight) hours as needed for nausea or vomiting., Starting 04/15/2016, Until Discontinued, Print         Delsa Grana, PA-C 06/02/16 1221    Orpah Greek, MD 06/05/16 [phone]

## 2016-07-01 ENCOUNTER — Encounter: Payer: Self-pay | Admitting: *Deleted

## 2016-07-01 ENCOUNTER — Emergency Department (INDEPENDENT_AMBULATORY_CARE_PROVIDER_SITE_OTHER)
Admission: EM | Admit: 2016-07-01 | Discharge: 2016-07-01 | Disposition: A | Discharge status: Home / Self Care | Payer: 59 | Source: Home / Self Care | Attending: Family Medicine | Admitting: Family Medicine

## 2016-07-01 DIAGNOSIS — H65192 Other acute nonsuppurative otitis media, left ear: Secondary | ICD-10-CM

## 2016-07-01 DIAGNOSIS — H6123 Impacted cerumen, bilateral: Secondary | ICD-10-CM

## 2016-07-01 DIAGNOSIS — H9203 Otalgia, bilateral: Secondary | ICD-10-CM

## 2016-07-01 HISTORY — DX: Anxiety disorder, unspecified: F41.9

## 2016-07-01 MED ORDER — PREDNISONE 20 MG PO TABS: ORAL_TABLET | ORAL | 0 refills | Status: DC

## 2016-07-01 MED ORDER — AMOXICILLIN 500 MG PO CAPS: 500.0000 mg | ORAL_CAPSULE | Freq: Three times a day (TID) | ORAL | 0 refills | Status: DC

## 2016-07-01 MED ORDER — HYDROCODONE-ACETAMINOPHEN 5-325 MG PO TABS: 1.0000 | ORAL_TABLET | Freq: Four times a day (QID) | ORAL | 0 refills | Status: DC | PRN

## 2016-07-01 MED ORDER — CARBAMIDE PEROXIDE 6.5 % OT SOLN: 5.0000 [drp] | Freq: Two times a day (BID) | OTIC | 0 refills | Status: DC

## 2016-07-01 NOTE — ED Triage Notes (Signed)
Pt c/o bilateral ear fullness x 1 yr, with pain x several weeks. She reports that the pain has been worse x 1 wk. She has taken Excedrin without relief. Denies fever.

## 2016-07-01 NOTE — ED Provider Notes (Signed)
CSN: [id number]     Arrival date & time 07/01/16  1804 History   First MD Initiated Contact with Patient 07/01/16 1856     Chief Complaint  Patient presents with  . Otalgia   (Consider location/radiation/quality/duration/timing/severity/associated sxs/prior Treatment) HPI Gina Lawson is a 25 y.o. female presenting to UC with c/o bilateral ear pain and pressure that has been gradually worsening over 1 year, however, severe pain in both ears, worse on Right side as well as throat pain started about 1 week ago.  Pain is 8/10.  She took Excedrin PTA w/o relief.  Denies fever, chills, congestion, n/v/d, or cough. No sick contacts or recent travel.  States she does not feel sick, just painful ears.    Past Medical History:  Diagnosis Date  . Anxiety   . Cervical cancer ([place])   . Fibromyalgia   . Herniated disc   . Vitamin D deficiency    Past Surgical History:  Procedure Laterality Date  . APPENDECTOMY    . CHOLECYSTECTOMY     History reviewed. No pertinent family history. Social History  Substance Use Topics  . Smoking status: Current Every Day Smoker    Types: Cigarettes  . Smokeless tobacco: Not on file  . Alcohol use Yes     Comment: occ   OB History    No data available     Review of Systems  Constitutional: Negative for chills, fatigue and fever.  HENT: Positive for ear pain ( bilateral ear pain and "fullness") and sore throat. Negative for congestion, trouble swallowing and voice change.   Respiratory: Negative for cough, chest tightness and shortness of breath.   Cardiovascular: Negative for chest pain and palpitations.  Gastrointestinal: Negative for abdominal pain, diarrhea, nausea and vomiting.    Allergies  Penicillins; Amoxicillin; Dilaudid [hydromorphone hcl]; Tramadol; and Morphine and related  Home Medications   Prior to Admission medications   Medication Sig Start Date End Date Taking? Authorizing Provider  amoxicillin (AMOXIL) 500 MG capsule Take 1  capsule (500 mg total) by mouth 3 (three) times daily. 07/01/16   Noland Fordyce, PA-C  carbamide peroxide (DEBROX) 6.5 % otic solution Place 5 drops into both ears 2 (two) times daily. 07/01/16   Noland Fordyce, PA-C  HYDROcodone-acetaminophen (NORCO/VICODIN) 5-325 MG tablet Take 1 tablet by mouth every 6 (six) hours as needed for moderate pain or severe pain. 07/01/16   Noland Fordyce, PA-C  norgestimate-ethinyl estradiol (SPRINTEC 28) 0.25-35 MG-MCG tablet Take 1 tablet by mouth daily. 01/30/14   Historical Provider, MD  predniSONE (DELTASONE) 20 MG tablet 3 tabs po day one, then 2 po daily x 4 days 07/01/16   Noland Fordyce, PA-C   Meds Ordered and Administered this Visit  Medications - No data to display  BP 132/90 (BP Location: Left Arm)   Pulse 89   Temp 98.4 F (36.9 C) (Oral)   Resp 16   Ht 5\' 1"  (1.549 m)   Wt 192 lb (87.1 kg)   LMP 06/21/2016   SpO2 100%   BMI 36.28 kg/m  No data found.   Physical Exam  Constitutional: She is oriented to person, place, and time. She appears well-developed and well-nourished.  HENT:  Head: Normocephalic and atraumatic.  Right Ear: No mastoid tenderness.  Left Ear: No mastoid tenderness.  Nose: Nose normal.  Mouth/Throat: Uvula is midline, oropharynx is clear and moist and mucous membranes are normal.  Bilateral ear cerumen impaction.  Left ear after cerumen removal: erythematous, hazy appearance. Right ear:  white film in ear canal, unable to visualize TM. Questionable discharge/fungus vs old remnants of Q-tip   Eyes: EOM are normal.  Neck: Normal range of motion. Neck supple.  Cardiovascular: Normal rate and regular rhythm.   Pulmonary/Chest: Effort normal and breath sounds normal. No stridor. No respiratory distress. She has no wheezes. She has no rales.  Musculoskeletal: Normal range of motion.  Lymphadenopathy:    She has no cervical adenopathy.  Neurological: She is alert and oriented to person, place, and time.  Skin: Skin is warm and  dry.  Psychiatric: She has a normal mood and affect. Her behavior is normal.  Nursing note and vitals reviewed.   Urgent Care Course   Clinical Course    Procedures (including critical care time)  Labs Review Labs Reviewed - No data to display  Imaging Review No results found.    MDM   1. Acute nonsuppurative otitis media of left ear   2. Cerumen impaction, bilateral   3. Acute ear pain, bilateral     Pt c/o worsening bilateral ear pain that has now worsened over the last 1 week, associated throat pain. Pt is afebrile. Denies URI symptoms.  Bilateral cerumen impaction.  Will attempt to irrigate ears to clear out impaction.    Left ear: c/w AOM Right ear: questionable fungal infection vs remnants of old cotton-tip applicator.   Will treat for Left AOM, encouraged pt to f/u with PCP or ENT if symptoms not improving in 1 week, especially in Right ear. Patient verbalized understanding and agreement with treatment plan.       Noland Fordyce, PA-C 07/03/16 1244

## 2016-08-12 ENCOUNTER — Encounter (HOSPITAL_COMMUNITY): Payer: Self-pay | Admitting: Emergency Medicine

## 2016-08-12 ENCOUNTER — Emergency Department (HOSPITAL_COMMUNITY)
Admission: EM | Admit: 2016-08-12 | Discharge: 2016-08-13 | Disposition: A | Discharge status: Home / Self Care | Payer: 59 | Attending: Emergency Medicine | Admitting: Emergency Medicine

## 2016-08-12 DIAGNOSIS — N3000 Acute cystitis without hematuria: Secondary | ICD-10-CM | POA: Diagnosis not present

## 2016-08-12 DIAGNOSIS — F17210 Nicotine dependence, cigarettes, uncomplicated: Secondary | ICD-10-CM | POA: Insufficient documentation

## 2016-08-12 DIAGNOSIS — R1030 Lower abdominal pain, unspecified: Secondary | ICD-10-CM | POA: Diagnosis present

## 2016-08-12 LAB — COMPREHENSIVE METABOLIC PANEL
ALBUMIN: 4.4 g/dL (ref 3.5–5.0)
ALT: 16 U/L (ref 14–54)
ANION GAP: 6 (ref 5–15)
AST: 24 U/L (ref 15–41)
Alkaline Phosphatase: 54 U/L (ref 38–126)
BUN: 11 mg/dL (ref 6–20)
CHLORIDE: 106 mmol/L (ref 101–111)
CO2: 26 mmol/L (ref 22–32)
Calcium: 9.9 mg/dL (ref 8.9–10.3)
Creatinine, Ser: 0.56 mg/dL (ref 0.44–1.00)
GFR calc Af Amer: >60 mL/min (ref >60)
GFR calc non Af Amer: >60 mL/min (ref >60)
GLUCOSE: 98 mg/dL (ref 65–99)
POTASSIUM: 5 mmol/L (ref 3.5–5.1)
SODIUM: 138 mmol/L (ref 135–145)
Total Bilirubin: 0.5 mg/dL (ref 0.3–1.2)
Total Protein: 6.7 g/dL (ref 6.5–8.1)

## 2016-08-12 LAB — CBC WITH DIFFERENTIAL/PLATELET
BASOS PCT: 0 %
Basophils Absolute: 0 10*3/uL (ref 0.0–0.1)
EOS ABS: 0.2 10*3/uL (ref 0.0–0.7)
Eosinophils Relative: 2 %
HCT: 44.1 % (ref 36.0–46.0)
HEMOGLOBIN: 15 g/dL (ref 12.0–15.0)
Lymphocytes Relative: 43 %
Lymphs Abs: 3.9 10*3/uL (ref 0.7–4.0)
MCH: 30.9 pg (ref 26.0–34.0)
MCHC: 34 g/dL (ref 30.0–36.0)
MCV: 90.7 fL (ref 78.0–100.0)
MONOS PCT: 4 %
Monocytes Absolute: 0.4 10*3/uL (ref 0.1–1.0)
NEUTROS PCT: 51 %
Neutro Abs: 4.7 10*3/uL (ref 1.7–7.7)
Platelets: 305 10*3/uL (ref 150–400)
RBC: 4.86 MIL/uL (ref 3.87–5.11)
RDW: 12.9 % (ref 11.5–15.5)
WBC: 9.2 10*3/uL (ref 4.0–10.5)

## 2016-08-12 LAB — URINALYSIS, ROUTINE W REFLEX MICROSCOPIC
Bilirubin Urine: NEGATIVE
GLUCOSE, UA: NEGATIVE mg/dL
KETONES UR: NEGATIVE mg/dL
LEUKOCYTES UA: NEGATIVE
NITRITE: NEGATIVE
PROTEIN: NEGATIVE mg/dL
Specific Gravity, Urine: 1.029 (ref 1.005–1.030)
pH: 7 (ref 5.0–8.0)

## 2016-08-12 LAB — POC URINE PREG, ED: Preg Test, Ur: NEGATIVE

## 2016-08-12 LAB — URINE MICROSCOPIC-ADD ON

## 2016-08-12 MED ORDER — OXYCODONE-ACETAMINOPHEN 5-325 MG PO TABS: 1.0000 | ORAL_TABLET | Freq: Four times a day (QID) | ORAL | 0 refills | Status: DC | PRN

## 2016-08-12 MED ORDER — KETOROLAC TROMETHAMINE 30 MG/ML IJ SOLN
30.0000 mg | Freq: Once | INTRAMUSCULAR | Status: AC
  Administered 2016-08-12: 30 mg via INTRAVENOUS
  Filled 2016-08-12: qty 1

## 2016-08-12 MED ORDER — OXYCODONE-ACETAMINOPHEN 5-325 MG PO TABS
1.0000 | ORAL_TABLET | Freq: Once | ORAL | Status: AC
  Administered 2016-08-12: 1 via ORAL
  Filled 2016-08-12: qty 1

## 2016-08-12 MED ORDER — CEPHALEXIN 500 MG PO CAPS: 500.0000 mg | ORAL_CAPSULE | Freq: Four times a day (QID) | ORAL | 0 refills | Status: DC

## 2016-08-12 MED ORDER — SODIUM CHLORIDE 0.9 % IV BOLUS (SEPSIS)
1000.0000 mL | Freq: Once | INTRAVENOUS | Status: AC
  Administered 2016-08-12: 1000 mL via INTRAVENOUS

## 2016-08-12 MED ORDER — ONDANSETRON 4 MG PO TBDP: ORAL_TABLET | ORAL | 0 refills | Status: DC

## 2016-08-12 MED ORDER — ONDANSETRON HCL 4 MG/2ML IJ SOLN
4.0000 mg | Freq: Once | INTRAMUSCULAR | Status: AC
Start: 2016-08-12 — End: 2016-08-12
  Administered 2016-08-12: 4 mg via INTRAVENOUS
  Filled 2016-08-12: qty 2

## 2016-08-12 MED ORDER — DEXTROSE 5 % IV SOLN
1.0000 g | Freq: Once | INTRAVENOUS | Status: AC
  Administered 2016-08-12: 1 g via INTRAVENOUS
  Filled 2016-08-12: qty 10

## 2016-08-12 NOTE — Discharge Instructions (Signed)
Follow-up with your family doctor next week for recheck. 

## 2016-08-12 NOTE — ED Triage Notes (Signed)
Pt c/o lower abd pain with pain in left flank x's 2 weeks.  St's pain has gotton worse with nausea and vomiting.  Pt denies vag. Discharge.  St's she has had UTI's in past with similar symptoms

## 2016-08-12 NOTE — ED Provider Notes (Signed)
[facility] DEPT Provider Note   CSN: [id number] Arrival date & time: 08/12/16  1908     History   Chief Complaint Chief Complaint  Patient presents with  . Abdominal Pain    HPI Gina Lawson is a 25 y.o. female.  Patient complains of dysuria frequency and lower abdominal pain. She has had UTIs before   The history is provided by the patient.  Abdominal Pain   This is a new problem. The current episode started 12 to 24 hours ago. The problem occurs constantly. The problem has not changed since onset.Associated with: Nothing. The pain is located in the suprapubic region. The quality of the pain is aching. The pain is at a severity of 4/10. The pain is moderate. Associated symptoms include dysuria and frequency. Pertinent negatives include anorexia, diarrhea, hematuria and headaches. Nothing aggravates the symptoms.    Past Medical History:  Diagnosis Date  . Anxiety   . Cervical cancer ([place])   . Fibromyalgia   . Herniated disc   . Vitamin D deficiency     There are no active problems to display for this patient.   Past Surgical History:  Procedure Laterality Date  . APPENDECTOMY    . CHOLECYSTECTOMY    . FRACTURE SURGERY      OB History    No data available       Home Medications    Prior to Admission medications   Medication Sig Start Date End Date Taking? Authorizing Provider  amoxicillin (AMOXIL) 500 MG capsule Take 1 capsule (500 mg total) by mouth 3 (three) times daily. 07/01/16   Noland Fordyce, PA-C  carbamide peroxide (DEBROX) 6.5 % otic solution Place 5 drops into both ears 2 (two) times daily. 07/01/16   Noland Fordyce, PA-C  cephALEXin (KEFLEX) 500 MG capsule Take 1 capsule (500 mg total) by mouth 4 (four) times daily. 08/12/16   Milton Ferguson, MD  HYDROcodone-acetaminophen (NORCO/VICODIN) 5-325 MG tablet Take 1 tablet by mouth every 6 (six) hours as needed for moderate pain or severe pain. 07/01/16   Noland Fordyce, PA-C  norgestimate-ethinyl  estradiol (SPRINTEC 28) 0.25-35 MG-MCG tablet Take 1 tablet by mouth daily. 01/30/14   Historical Provider, MD  ondansetron (ZOFRAN ODT) 4 MG disintegrating tablet 4mg  ODT q4 hours prn nausea/vomit 08/12/16   Milton Ferguson, MD  oxyCODONE-acetaminophen (PERCOCET) 5-325 MG tablet Take 1 tablet by mouth every 6 (six) hours as needed. 08/12/16   Milton Ferguson, MD  predniSONE (DELTASONE) 20 MG tablet 3 tabs po day one, then 2 po daily x 4 days 07/01/16   Noland Fordyce, PA-C    Family History No family history on file.  Social History Social History  Substance Use Topics  . Smoking status: Current Every Day Smoker    Types: Cigarettes  . Smokeless tobacco: Never Used  . Alcohol use Yes     Comment: occ     Allergies   Penicillins; Amoxicillin; Dilaudid [hydromorphone hcl]; Tramadol; and Morphine and related   Review of Systems Review of Systems  Constitutional: Negative for appetite change and fatigue.  HENT: Negative for congestion, ear discharge and sinus pressure.   Eyes: Negative for discharge.  Respiratory: Negative for cough.   Cardiovascular: Negative for chest pain.  Gastrointestinal: Positive for abdominal pain. Negative for anorexia and diarrhea.  Genitourinary: Positive for dysuria and frequency. Negative for hematuria.  Musculoskeletal: Negative for back pain.  Skin: Negative for rash.  Neurological: Negative for seizures and headaches.  Psychiatric/Behavioral: Negative for hallucinations.  Physical Exam Updated Vital Signs BP 141/93 (BP Location: Left Arm)   Pulse 95   Temp 98.1 F (36.7 C) (Oral)   Resp 19   Ht 5' 1.5" (1.562 m)   Wt 95 lb (43.1 kg)   LMP 08/12/2016   SpO2 100%   BMI 17.66 kg/m   Physical Exam  Constitutional: She is oriented to person, place, and time. She appears well-developed.  HENT:  Head: Normocephalic.  Eyes: Conjunctivae and EOM are normal. No scleral icterus.  Neck: Neck supple. No thyromegaly present.  Cardiovascular:  Normal rate and regular rhythm.  Exam reveals no gallop and no friction rub.   No murmur heard. Pulmonary/Chest: No stridor. She has no wheezes. She has no rales. She exhibits no tenderness.  Abdominal: She exhibits no distension. There is tenderness. There is no rebound.  Tender suprapubic  Genitourinary:  Genitourinary Comments: Tender left flank  Musculoskeletal: Normal range of motion. She exhibits no edema.  Lymphadenopathy:    She has no cervical adenopathy.  Neurological: She is oriented to person, place, and time. She exhibits normal muscle tone. Coordination normal.  Skin: No rash noted. No erythema.  Psychiatric: She has a normal mood and affect. Her behavior is normal.     ED Treatments / Results  Labs (all labs ordered are listed, but only abnormal results are displayed) Labs Reviewed  URINALYSIS, ROUTINE W REFLEX MICROSCOPIC (NOT AT [facility]) - Abnormal; Notable for the following:       Result Value   APPearance CLOUDY (*)    Hgb urine dipstick SMALL (*)    All other components within normal limits  URINE MICROSCOPIC-ADD ON - Abnormal; Notable for the following:    Squamous Epithelial / LPF 6-30 (*)    Bacteria, UA MANY (*)    All other components within normal limits  URINE CULTURE  CBC WITH DIFFERENTIAL/PLATELET  COMPREHENSIVE METABOLIC PANEL  POC URINE PREG, ED    EKG  EKG Interpretation None       Radiology No results found.  Procedures Procedures (including critical care time)  Medications Ordered in ED Medications  sodium chloride 0.9 % bolus 1,000 mL (1,000 mLs Intravenous New Bag/Given 08/12/16 2220)  cefTRIAXone (ROCEPHIN) 1 g in dextrose 5 % 50 mL IVPB (1 g Intravenous New Bag/Given 08/12/16 2220)  sodium chloride 0.9 % bolus 1,000 mL (1,000 mLs Intravenous New Bag/Given 08/12/16 2220)  ondansetron (ZOFRAN) injection 4 mg (4 mg Intravenous Given 08/12/16 2222)  ketorolac (TORADOL) 30 MG/ML injection 30 mg (30 mg Intravenous Given 08/12/16 2223)    oxyCODONE-acetaminophen (PERCOCET/ROXICET) 5-325 MG per tablet 1 tablet (1 tablet Oral Given 08/12/16 2305)     Initial Impression / Assessment and Plan / ED Course  I have reviewed the triage vital signs and the nursing notes.  Pertinent labs & imaging results that were available during my care of the patient were reviewed by me and considered in my medical decision making (see chart for details).  Clinical Course    Patient with bacteriuria in her urine. Suspect urinary tract infection. Patient started on Keflex Zofran and Percocet and will follow-up with her PCP  Final Clinical Impressions(s) / ED Diagnoses   Final diagnoses:  Acute cystitis without hematuria    New Prescriptions New Prescriptions   CEPHALEXIN (KEFLEX) 500 MG CAPSULE    Take 1 capsule (500 mg total) by mouth 4 (four) times daily.   ONDANSETRON (ZOFRAN ODT) 4 MG DISINTEGRATING TABLET    4mg  ODT q4 hours prn nausea/vomit  OXYCODONE-ACETAMINOPHEN (PERCOCET) 5-325 MG TABLET    Take 1 tablet by mouth every 6 (six) hours as needed.     Milton Ferguson, MD 08/12/16 [phone]

## 2016-08-14 LAB — URINE CULTURE: CULTURE: NO GROWTH

## 2016-09-18 ENCOUNTER — Other Ambulatory Visit: Payer: Self-pay | Admitting: Obstetrics and Gynecology

## 2016-09-22 LAB — CYTOLOGY - PAP

## 2016-10-10 ENCOUNTER — Other Ambulatory Visit: Payer: Self-pay | Admitting: Obstetrics & Gynecology

## 2016-10-20 ENCOUNTER — Emergency Department (HOSPITAL_COMMUNITY)
Admission: EM | Admit: 2016-10-20 | Discharge: 2016-10-21 | Disposition: A | Discharge status: Home / Self Care | Payer: 59 | Attending: Emergency Medicine | Admitting: Emergency Medicine

## 2016-10-20 ENCOUNTER — Encounter (HOSPITAL_COMMUNITY): Payer: Self-pay

## 2016-10-20 DIAGNOSIS — O26891 Other specified pregnancy related conditions, first trimester: Secondary | ICD-10-CM | POA: Insufficient documentation

## 2016-10-20 DIAGNOSIS — Z8541 Personal history of malignant neoplasm of cervix uteri: Secondary | ICD-10-CM | POA: Insufficient documentation

## 2016-10-20 DIAGNOSIS — F17210 Nicotine dependence, cigarettes, uncomplicated: Secondary | ICD-10-CM | POA: Insufficient documentation

## 2016-10-20 DIAGNOSIS — R102 Pelvic and perineal pain: Secondary | ICD-10-CM | POA: Diagnosis not present

## 2016-10-20 DIAGNOSIS — O99331 Smoking (tobacco) complicating pregnancy, first trimester: Secondary | ICD-10-CM | POA: Diagnosis not present

## 2016-10-20 DIAGNOSIS — Z3A08 8 weeks gestation of pregnancy: Secondary | ICD-10-CM | POA: Diagnosis not present

## 2016-10-20 DIAGNOSIS — Z79899 Other long term (current) drug therapy: Secondary | ICD-10-CM | POA: Insufficient documentation

## 2016-10-20 LAB — URINALYSIS, ROUTINE W REFLEX MICROSCOPIC
BILIRUBIN URINE: NEGATIVE
GLUCOSE, UA: NEGATIVE mg/dL
Hgb urine dipstick: NEGATIVE
KETONES UR: NEGATIVE mg/dL
LEUKOCYTES UA: NEGATIVE
NITRITE: NEGATIVE
PROTEIN: NEGATIVE mg/dL
Specific Gravity, Urine: 1.015 (ref 1.005–1.030)
pH: 8 (ref 5.0–8.0)

## 2016-10-20 LAB — CBC
HCT: 40.8 % (ref 36.0–46.0)
Hemoglobin: 13.9 g/dL (ref 12.0–15.0)
MCH: 30.6 pg (ref 26.0–34.0)
MCHC: 34.1 g/dL (ref 30.0–36.0)
MCV: 89.9 fL (ref 78.0–100.0)
PLATELETS: 340 10*3/uL (ref 150–400)
RBC: 4.54 MIL/uL (ref 3.87–5.11)
RDW: 12.9 % (ref 11.5–15.5)
WBC: 9.9 10*3/uL (ref 4.0–10.5)

## 2016-10-20 LAB — COMPREHENSIVE METABOLIC PANEL
ALK PHOS: 54 U/L (ref 38–126)
ALT: 17 U/L (ref 14–54)
AST: 23 U/L (ref 15–41)
Albumin: 4.4 g/dL (ref 3.5–5.0)
Anion gap: 10 (ref 5–15)
BILIRUBIN TOTAL: 0.2 mg/dL — AB (ref 0.3–1.2)
BUN: 7 mg/dL (ref 6–20)
CALCIUM: 9.7 mg/dL (ref 8.9–10.3)
CO2: 26 mmol/L (ref 22–32)
CREATININE: 0.5 mg/dL (ref 0.44–1.00)
Chloride: 105 mmol/L (ref 101–111)
GFR calc Af Amer: >60 mL/min (ref >60)
Glucose, Bld: 96 mg/dL (ref 65–99)
Potassium: 4 mmol/L (ref 3.5–5.1)
Sodium: 141 mmol/L (ref 135–145)
TOTAL PROTEIN: 7 g/dL (ref 6.5–8.1)

## 2016-10-20 LAB — LIPASE, BLOOD: Lipase: 31 U/L (ref 11–51)

## 2016-10-20 LAB — PREGNANCY, URINE: PREG TEST UR: POSITIVE — AB

## 2016-10-20 LAB — I-STAT CG4 LACTIC ACID, ED: Lactic Acid, Venous: 1.34 mmol/L (ref 0.5–1.9)

## 2016-10-20 MED ORDER — ONDANSETRON HCL 4 MG/2ML IJ SOLN
4.0000 mg | Freq: Once | INTRAMUSCULAR | Status: AC
  Administered 2016-10-20: 4 mg via INTRAVENOUS
  Filled 2016-10-20: qty 2

## 2016-10-20 MED ORDER — SODIUM CHLORIDE 0.9 % IV SOLN
Freq: Once | INTRAVENOUS | Status: AC
  Administered 2016-10-21: 02:00:00 via INTRAVENOUS

## 2016-10-20 MED ORDER — FENTANYL CITRATE (PF) 100 MCG/2ML IJ SOLN
50.0000 ug | INTRAMUSCULAR | Status: DC | PRN
  Administered 2016-10-20 – 2016-10-21 (×4): 50 ug via INTRAVENOUS
  Filled 2016-10-20 (×4): qty 2

## 2016-10-20 MED ORDER — SODIUM CHLORIDE 0.9 % IV BOLUS (SEPSIS)
500.0000 mL | Freq: Once | INTRAVENOUS | Status: AC
  Administered 2016-10-21: 500 mL via INTRAVENOUS

## 2016-10-20 NOTE — ED Triage Notes (Signed)
Pt endorses abd pain x 2.5 weeks since having D&C done. Pt also endorses hematuria from Wed to Sat of last week. Pt saw OBGYN and tested negative for UTI and was told she needs laparoscopic surgery "because my uterus or cervic may have been punctured. Pt also endorses fever, afebrile here. Pt has abd distention.

## 2016-10-20 NOTE — ED Notes (Signed)
EDP at bedside  

## 2016-10-20 NOTE — ED Provider Notes (Signed)
[facility] DEPT Provider Note   CSN: [id number] Arrival date & time: 10/20/16  1942     History   Chief Complaint Chief Complaint  Patient presents with  . Abdominal Pain    HPI Timmya Doser is a 25 y.o. female.   She presents for lower abdominal pain. She is approximately 2 weeks status post office D&C at [facility] here in [place].  She states she was 8 weeks and had incomplete SAB. Since that time she describes progressive lower abdominal pain. Pt states  that she's been seen in the office twice. Most recently had a bloody urine specimen was given antibiotics for UTI. That she has had a repeat ultrasound since that time and was told it was "fine"   However she states the culture of her urine proved negative. Progressive lower abdominal pain now. Last 2 days been feeling pain in her shoulders. Painful. Not lightheaded dizzy or syncopal. Not febrile. No vaginal bleeding or discharge.Marland Kitchen     HPI  Past Medical History:  Diagnosis Date  . Anxiety   . Cervical cancer ([place])   . Fibromyalgia   . Herniated disc   . Vitamin D deficiency     There are no active problems to display for this patient.   Past Surgical History:  Procedure Laterality Date  . APPENDECTOMY    . CHOLECYSTECTOMY    . FRACTURE SURGERY      OB History    No data available       Home Medications    Prior to Admission medications   Medication Sig Start Date End Date Taking? Authorizing Provider  carbamide peroxide (DEBROX) 6.5 % otic solution Place 5 drops into both ears 2 (two) times daily. Patient not taking: Reported on 10/20/2016 07/01/16   Noland Fordyce, PA-C  cephALEXin (KEFLEX) 500 MG capsule Take 1 capsule (500 mg total) by mouth 4 (four) times daily. Patient not taking: Reported on 10/20/2016 08/12/16   Milton Ferguson, MD  HYDROcodone-acetaminophen (NORCO/VICODIN) 5-325 MG tablet Take 1 tablet by mouth every 6 (six) hours as needed for moderate pain or severe pain. Patient  not taking: Reported on 10/20/2016 07/01/16   Noland Fordyce, PA-C  norgestimate-ethinyl estradiol (SPRINTEC 28) 0.25-35 MG-MCG tablet Take 1 tablet by mouth daily. 01/30/14   Historical Provider, MD  ondansetron (ZOFRAN ODT) 4 MG disintegrating tablet 4mg  ODT q4 hours prn nausea/vomit Patient not taking: Reported on 10/20/2016 08/12/16   Milton Ferguson, MD  oxyCODONE-acetaminophen (PERCOCET/ROXICET) 5-325 MG tablet Take 1 tablet by mouth every 4 (four) hours as needed for severe pain. 10/21/16   Charlann Lange, PA-C    Family History History reviewed. No pertinent family history.  Social History Social History  Substance Use Topics  . Smoking status: Current Every Day Smoker    Packs/day: 0.50    Types: Cigarettes  . Smokeless tobacco: Never Used  . Alcohol use Yes     Comment: occ     Allergies   Penicillins; Tramadol; Amoxicillin; Dilaudid [hydromorphone hcl]; and Morphine and related   Review of Systems Review of Systems  Constitutional: Negative for appetite change, chills, diaphoresis, fatigue and fever.  HENT: Negative for mouth sores, sore throat and trouble swallowing.   Eyes: Negative for visual disturbance.  Respiratory: Negative for cough, chest tightness, shortness of breath and wheezing.   Cardiovascular: Negative for chest pain.  Gastrointestinal: Positive for abdominal distention. Negative for abdominal pain, diarrhea, nausea and vomiting.  Endocrine: Negative for polydipsia, polyphagia and polyuria.  Genitourinary: Positive  for hematuria and urgency. Negative for dysuria and frequency.  Musculoskeletal: Negative for gait problem.  Skin: Negative for color change, pallor and rash.  Neurological: Negative for dizziness, syncope, light-headedness and headaches.  Hematological: Does not bruise/bleed easily.  Psychiatric/Behavioral: Negative for behavioral problems and confusion.     Physical Exam Updated Vital Signs BP 120/68   Pulse 90   Temp 98.4 F (36.9 C)  (Oral)   Resp 16   Ht 5\' 1"  (1.549 m)   Wt 89 lb 8 oz (40.6 kg)   LMP 08/10/2016 (Exact Date) Comment: Pt sts she had pregnancy terminated couple of weeks ago  SpO2 98%   BMI 16.91 kg/m   Physical Exam  Constitutional: She is oriented to person, place, and time. She appears well-developed and well-nourished. No distress.  HENT:  Head: Normocephalic.  Eyes: Conjunctivae are normal. Pupils are equal, round, and reactive to light. No scleral icterus.  Conjunctiva not pale  Neck: Normal range of motion. Neck supple. No thyromegaly present.  Cardiovascular: Normal rate and regular rhythm.  Exam reveals no gallop and no friction rub.   No murmur heard. Pulmonary/Chest: Effort normal and breath sounds normal. No respiratory distress. She has no wheezes. She has no rales.  Abdominal: Soft. Bowel sounds are normal. She exhibits no distension. There is tenderness. There is no rebound.  Soft. Suprapubic tenderness. No frank distention.  Musculoskeletal: Normal range of motion.  Neurological: She is alert and oriented to person, place, and time.  Skin: Skin is warm and dry. No rash noted.  Psychiatric: She has a normal mood and affect. Her behavior is normal.     ED Treatments / Results  Labs (all labs ordered are listed, but only abnormal results are displayed) Labs Reviewed  COMPREHENSIVE METABOLIC PANEL - Abnormal; Notable for the following:       Result Value   Total Bilirubin 0.2 (*)    All other components within normal limits  URINALYSIS, ROUTINE W REFLEX MICROSCOPIC - Abnormal; Notable for the following:    APPearance CLOUDY (*)    All other components within normal limits  PREGNANCY, URINE - Abnormal; Notable for the following:    Preg Test, Ur POSITIVE (*)    All other components within normal limits  LIPASE, BLOOD  CBC  POC URINE PREG, ED  I-STAT CG4 LACTIC ACID, ED    EKG  EKG Interpretation None       Radiology US Transvaginal Non-ob  Result Date:  10/21/2016 CLINICAL DATA:  25 year old female with pelvic pain. Patient is status post D&C with pain for the past 2-3 weeks. EXAM: TRANSABDOMINAL AND TRANSVAGINAL ULTRASOUND OF PELVIS TECHNIQUE: Both transabdominal and transvaginal ultrasound examinations of the pelvis were performed. Transabdominal technique was performed for global imaging of the pelvis including uterus, ovaries, adnexal regions, and pelvic cul-de-sac. It was necessary to proceed with endovaginal exam following the transabdominal exam to visualize the endometrium and the ovaries. COMPARISON:  Abdominal CT dated 10/21/2016 and pelvic ultrasound dated 04/15/2016 FINDINGS: Uterus Measurements: 7.8 x 3.8 x 6.5 cm. The uterus is anteverted. No fibroids or other mass visualized. Endometrium Thickness: 5 mm.  No focal abnormality visualized. Right ovary Measurements: 3.2 x 1.6 x 2.3 cm. Right ovarian dominant follicle measuring 1.2 x 0.7 x 1.0 cm. Left ovary Measurements: 2.2 x 1.4 x 1.6 cm. Multiple follicles measuring up to 1.0 x 0.8 x 0.8 cm. Flow is noted to both ovaries on color Doppler images. Other findings Small free fluid within the pelvis. IMPRESSION:  Unremarkable pelvic ultrasound. No retained products of conception identified. Electronically Signed   By: Anner Crete M.D.   On: 10/21/2016 02:49   US Pelvis Complete  Result Date: 10/21/2016 CLINICAL DATA:  25 year old female with pelvic pain. Patient is status post D&C with pain for the past 2-3 weeks. EXAM: TRANSABDOMINAL AND TRANSVAGINAL ULTRASOUND OF PELVIS TECHNIQUE: Both transabdominal and transvaginal ultrasound examinations of the pelvis were performed. Transabdominal technique was performed for global imaging of the pelvis including uterus, ovaries, adnexal regions, and pelvic cul-de-sac. It was necessary to proceed with endovaginal exam following the transabdominal exam to visualize the endometrium and the ovaries. COMPARISON:  Abdominal CT dated 10/21/2016 and pelvic  ultrasound dated 04/15/2016 FINDINGS: Uterus Measurements: 7.8 x 3.8 x 6.5 cm. The uterus is anteverted. No fibroids or other mass visualized. Endometrium Thickness: 5 mm.  No focal abnormality visualized. Right ovary Measurements: 3.2 x 1.6 x 2.3 cm. Right ovarian dominant follicle measuring 1.2 x 0.7 x 1.0 cm. Left ovary Measurements: 2.2 x 1.4 x 1.6 cm. Multiple follicles measuring up to 1.0 x 0.8 x 0.8 cm. Flow is noted to both ovaries on color Doppler images. Other findings Small free fluid within the pelvis. IMPRESSION: Unremarkable pelvic ultrasound. No retained products of conception identified. Electronically Signed   By: Anner Crete M.D.   On: 10/21/2016 02:49   Ct Abdomen Pelvis W Contrast  Result Date: 10/21/2016 CLINICAL DATA:  25 y/o F; severe lower abdominal pain with nausea. History of appendectomy and cholecystectomy. EXAM: CT ABDOMEN AND PELVIS WITH CONTRAST TECHNIQUE: Multidetector CT imaging of the abdomen and pelvis was performed using the standard protocol following bolus administration of intravenous contrast. CONTRAST:  90 cc ISOVUE-300 IOPAMIDOL (ISOVUE-300) INJECTION 61% COMPARISON:  04/15/2016 pelvic ultrasound and renal ultrasound. FINDINGS: Lower chest: No acute abnormality. Hepatobiliary: No focal liver abnormality is seen. Status post cholecystectomy. No biliary dilatation. Pancreas: Unremarkable. No pancreatic ductal dilatation or surrounding inflammatory changes. Spleen: Normal in size without focal abnormality. Adrenals/Urinary Tract: Adrenal glands are unremarkable. Kidneys are normal, without renal calculi, focal lesion, or hydronephrosis. Bladder is unremarkable. Stomach/Bowel: Stomach is within normal limits. Status post appendectomy. No evidence of bowel wall thickening, distention, or inflammatory changes. Vascular/Lymphatic: No significant vascular findings are present. No enlarged abdominal or pelvic lymph nodes. Reproductive: Uterus and bilateral adnexa are  unremarkable. Other: No abdominal wall hernia or abnormality. No abdominopelvic ascites. Musculoskeletal: No acute or significant osseous findings. L5-S1 disc bulge. IMPRESSION: No acute process is identified as explanation for pain. Status post cholecystectomy and appendectomy. Otherwise unremarkable CT. Electronically Signed   By: Kristine Garbe M.D.   On: 10/21/2016 00:58    Procedures Procedures (including critical care time)  Medications Ordered in ED Medications  ondansetron (ZOFRAN) injection 4 mg (4 mg Intravenous Given 10/20/16 2353)  sodium chloride 0.9 % bolus 500 mL (0 mLs Intravenous Stopped 10/21/16 0221)  0.9 %  sodium chloride infusion ( Intravenous Stopped 10/21/16 0415)  iopamidol (ISOVUE-300) 61 % injection (90 mLs  Contrast Given 10/21/16 0018)     Initial Impression / Assessment and Plan / ED Course  I have reviewed the triage vital signs and the nursing notes.  Pertinent labs & imaging results that were available during my care of the patient were reviewed by me and considered in my medical decision making (see chart for details).  Clinical Course     Plan CT scan to rule out intraperitoneal fluid or air suggestive of perforation. Labs, fluids, pain medications.  HCG remains positive.  I am aware.  Likely RPOC, however recommend Ct before [place] to r/o perforation/free air, intraperitoneal fluid/bloood/abscess.    Final Clinical Impressions(s) / ED Diagnoses   Final diagnoses:  Pelvic pain  Pelvic pain in female    New Prescriptions Discharge Medication List as of 10/21/2016  3:29 AM       Tanna Furry, MD 10/21/16 1805

## 2016-10-21 ENCOUNTER — Emergency Department (HOSPITAL_COMMUNITY): Payer: 59

## 2016-10-21 ENCOUNTER — Encounter (HOSPITAL_COMMUNITY): Payer: Self-pay | Admitting: Radiology

## 2016-10-21 MED ORDER — IOPAMIDOL (ISOVUE-300) INJECTION 61%
INTRAVENOUS | Status: AC
  Administered 2016-10-21: 90 mL
  Filled 2016-10-21: qty 100

## 2016-10-21 MED ORDER — OXYCODONE-ACETAMINOPHEN 5-325 MG PO TABS: 1.0000 | ORAL_TABLET | ORAL | 0 refills | Status: DC | PRN

## 2016-10-21 NOTE — ED Notes (Addendum)
Pt transported to US

## 2016-10-21 NOTE — ED Notes (Signed)
EDP at bedside  

## 2016-10-21 NOTE — ED Notes (Signed)
Patient left at this time with all belongings, refused wheelchair. 

## 2016-10-21 NOTE — ED Notes (Signed)
Pt returned from US

## 2016-10-24 ENCOUNTER — Other Ambulatory Visit: Payer: Self-pay | Admitting: Obstetrics & Gynecology

## 2016-10-24 NOTE — Progress Notes (Signed)
Left message for [facility] @ [facility] regarding  preop labs.  Cbc and urine hcg in epic 4 days ago.  Do we repeat urine, and will istat 8 be sufficient?

## 2016-10-24 NOTE — Progress Notes (Signed)
Spoke with Dr Alwyn Pea, repeat urine pregnancy 10/27/16,istat 8 instead of CBC .

## 2016-10-24 NOTE — Progress Notes (Signed)
Unable to reach pt or leave message on her voicemail.  Left message w mother.  Erline Levine at [facility] informed.

## 2016-10-24 NOTE — Progress Notes (Addendum)
After receiving call from [facility] that she hasn't been able to contact pt as well, I attempted to contact pt again.  Voicemail full.  Left message  On mother's voicemail for pt to come at 1000 12/18.  NPO p mn 12/17, including no candy mint or gum. Pt to bring photo ID and insurance card.

## 2016-10-27 ENCOUNTER — Ambulatory Visit (HOSPITAL_BASED_OUTPATIENT_CLINIC_OR_DEPARTMENT_OTHER)
Admission: RE | Admit: 2016-10-27 | Discharge: 2016-10-27 | Disposition: A | Discharge status: Home / Self Care | Payer: 59 | Source: Ambulatory Visit | Attending: Obstetrics & Gynecology | Admitting: Obstetrics & Gynecology

## 2016-10-27 ENCOUNTER — Encounter (HOSPITAL_BASED_OUTPATIENT_CLINIC_OR_DEPARTMENT_OTHER)
Admission: RE | Disposition: A | Discharge status: Home / Self Care | Payer: Self-pay | Source: Ambulatory Visit | Attending: Obstetrics & Gynecology

## 2016-10-27 ENCOUNTER — Ambulatory Visit (HOSPITAL_BASED_OUTPATIENT_CLINIC_OR_DEPARTMENT_OTHER): Payer: 59 | Admitting: Anesthesiology

## 2016-10-27 ENCOUNTER — Encounter (HOSPITAL_BASED_OUTPATIENT_CLINIC_OR_DEPARTMENT_OTHER): Payer: Self-pay | Admitting: Anesthesiology

## 2016-10-27 DIAGNOSIS — R102 Pelvic and perineal pain: Secondary | ICD-10-CM | POA: Diagnosis not present

## 2016-10-27 DIAGNOSIS — N938 Other specified abnormal uterine and vaginal bleeding: Secondary | ICD-10-CM | POA: Insufficient documentation

## 2016-10-27 DIAGNOSIS — Z881 Allergy status to other antibiotic agents status: Secondary | ICD-10-CM | POA: Insufficient documentation

## 2016-10-27 DIAGNOSIS — Z888 Allergy status to other drugs, medicaments and biological substances status: Secondary | ICD-10-CM | POA: Diagnosis not present

## 2016-10-27 DIAGNOSIS — F17210 Nicotine dependence, cigarettes, uncomplicated: Secondary | ICD-10-CM | POA: Insufficient documentation

## 2016-10-27 DIAGNOSIS — E559 Vitamin D deficiency, unspecified: Secondary | ICD-10-CM | POA: Diagnosis not present

## 2016-10-27 DIAGNOSIS — F419 Anxiety disorder, unspecified: Secondary | ICD-10-CM | POA: Diagnosis not present

## 2016-10-27 DIAGNOSIS — Z9049 Acquired absence of other specified parts of digestive tract: Secondary | ICD-10-CM | POA: Insufficient documentation

## 2016-10-27 DIAGNOSIS — Z79899 Other long term (current) drug therapy: Secondary | ICD-10-CM | POA: Insufficient documentation

## 2016-10-27 DIAGNOSIS — O26899 Other specified pregnancy related conditions, unspecified trimester: Secondary | ICD-10-CM | POA: Insufficient documentation

## 2016-10-27 DIAGNOSIS — Z885 Allergy status to narcotic agent status: Secondary | ICD-10-CM | POA: Diagnosis not present

## 2016-10-27 DIAGNOSIS — M797 Fibromyalgia: Secondary | ICD-10-CM | POA: Diagnosis not present

## 2016-10-27 DIAGNOSIS — Z880 Allergy status to penicillin: Secondary | ICD-10-CM | POA: Diagnosis not present

## 2016-10-27 HISTORY — PX: HYSTEROSCOPY: SHX211

## 2016-10-27 HISTORY — DX: Malignant (primary) neoplasm, unspecified: C80.1

## 2016-10-27 HISTORY — PX: LAPAROSCOPY: SHX197

## 2016-10-27 LAB — POCT I-STAT, CHEM 8
BUN: 15 mg/dL (ref 6–20)
CALCIUM ION: 1.24 mmol/L (ref 1.15–1.40)
CHLORIDE: 106 mmol/L (ref 101–111)
Creatinine, Ser: 0.6 mg/dL (ref 0.44–1.00)
Glucose, Bld: 93 mg/dL (ref 65–99)
HEMATOCRIT: 39 % (ref 36.0–46.0)
Hemoglobin: 13.3 g/dL (ref 12.0–15.0)
POTASSIUM: 3.8 mmol/L (ref 3.5–5.1)
SODIUM: 141 mmol/L (ref 135–145)
TCO2: 21 mmol/L (ref 0–100)

## 2016-10-27 LAB — POCT PREGNANCY, URINE: PREG TEST UR: POSITIVE — AB

## 2016-10-27 SURGERY — HYSTEROSCOPY
Anesthesia: General | Site: Vagina

## 2016-10-27 MED ORDER — BUPIVACAINE HCL (PF) 0.5 % IJ SOLN
INTRAMUSCULAR | Status: DC | PRN
  Administered 2016-10-27: 2 mL

## 2016-10-27 MED ORDER — SUGAMMADEX SODIUM 200 MG/2ML IV SOLN
INTRAVENOUS | Status: DC | PRN
  Administered 2016-10-27: 150 mg via INTRAVENOUS

## 2016-10-27 MED ORDER — FENTANYL CITRATE (PF) 100 MCG/2ML IJ SOLN
25.0000 ug | INTRAMUSCULAR | Status: DC | PRN
  Administered 2016-10-27: 50 ug via INTRAVENOUS
  Filled 2016-10-27: qty 1

## 2016-10-27 MED ORDER — ROCURONIUM BROMIDE 50 MG/5ML IV SOSY
PREFILLED_SYRINGE | INTRAVENOUS | Status: DC | PRN
  Administered 2016-10-27: 30 mg via INTRAVENOUS

## 2016-10-27 MED ORDER — OXYCODONE-ACETAMINOPHEN 5-325 MG PO TABS: 1.0000 | ORAL_TABLET | ORAL | 0 refills | Status: DC | PRN

## 2016-10-27 MED ORDER — OXYCODONE-ACETAMINOPHEN 5-325 MG PO TABS
1.0000 | ORAL_TABLET | Freq: Once | ORAL | Status: AC
  Administered 2016-10-27: 1 via ORAL
  Filled 2016-10-27: qty 1

## 2016-10-27 MED ORDER — IBUPROFEN 800 MG PO TABS: 800.0000 mg | ORAL_TABLET | Freq: Three times a day (TID) | ORAL | 1 refills | Status: DC | PRN

## 2016-10-27 MED ORDER — LIDOCAINE HCL 4 % EX SOLN
CUTANEOUS | Status: DC | PRN
  Administered 2016-10-27: 2 mL via TOPICAL

## 2016-10-27 MED ORDER — PROPOFOL 10 MG/ML IV BOLUS
INTRAVENOUS | Status: AC
Start: 2016-10-27 — End: 2016-10-27
  Filled 2016-10-27: qty 20

## 2016-10-27 MED ORDER — ONDANSETRON HCL 4 MG/2ML IJ SOLN
INTRAMUSCULAR | Status: DC | PRN
Start: 2016-10-27 — End: 2016-10-27
  Administered 2016-10-27: 4 mg via INTRAVENOUS

## 2016-10-27 MED ORDER — KETOROLAC TROMETHAMINE 30 MG/ML IJ SOLN
INTRAMUSCULAR | Status: DC | PRN
  Administered 2016-10-27: 30 mg via INTRAVENOUS

## 2016-10-27 MED ORDER — LACTATED RINGERS IR SOLN
Status: DC | PRN
  Administered 2016-10-27: 3000 mL

## 2016-10-27 MED ORDER — PROMETHAZINE HCL 25 MG/ML IJ SOLN
6.2500 mg | INTRAMUSCULAR | Status: DC | PRN
  Filled 2016-10-27: qty 1

## 2016-10-27 MED ORDER — SUGAMMADEX SODIUM 200 MG/2ML IV SOLN
INTRAVENOUS | Status: AC
  Filled 2016-10-27: qty 2

## 2016-10-27 MED ORDER — LIDOCAINE 2% (20 MG/ML) 5 ML SYRINGE
INTRAMUSCULAR | Status: AC
  Filled 2016-10-27: qty 5

## 2016-10-27 MED ORDER — DEXAMETHASONE SODIUM PHOSPHATE 10 MG/ML IJ SOLN
INTRAMUSCULAR | Status: AC
  Filled 2016-10-27: qty 1

## 2016-10-27 MED ORDER — EPHEDRINE 5 MG/ML INJ
INTRAVENOUS | Status: AC
  Filled 2016-10-27: qty 10

## 2016-10-27 MED ORDER — KETOROLAC TROMETHAMINE 30 MG/ML IJ SOLN
15.0000 mg | Freq: Once | INTRAMUSCULAR | Status: DC | PRN
Start: 2016-10-27 — End: 2016-10-27
  Filled 2016-10-27: qty 1

## 2016-10-27 MED ORDER — OXYCODONE-ACETAMINOPHEN 5-325 MG PO TABS
ORAL_TABLET | ORAL | Status: AC
  Filled 2016-10-27: qty 1

## 2016-10-27 MED ORDER — KETOROLAC TROMETHAMINE 30 MG/ML IJ SOLN
INTRAMUSCULAR | Status: AC
  Filled 2016-10-27: qty 1

## 2016-10-27 MED ORDER — DEXTROSE 5 % IV SOLN
INTRAVENOUS | Status: AC
  Administered 2016-10-27: 100 mL via INTRAVENOUS
  Filled 2016-10-27 (×2): qty 5

## 2016-10-27 MED ORDER — FENTANYL CITRATE (PF) 100 MCG/2ML IJ SOLN
INTRAMUSCULAR | Status: AC
  Filled 2016-10-27: qty 2

## 2016-10-27 MED ORDER — MIDAZOLAM HCL 2 MG/2ML IJ SOLN
INTRAMUSCULAR | Status: AC
  Filled 2016-10-27: qty 2

## 2016-10-27 MED ORDER — SCOPOLAMINE 1 MG/3DAYS TD PT72
MEDICATED_PATCH | TRANSDERMAL | Status: AC
  Filled 2016-10-27: qty 1

## 2016-10-27 MED ORDER — LACTATED RINGERS IV SOLN
INTRAVENOUS | Status: DC
  Administered 2016-10-27 (×2): via INTRAVENOUS
  Filled 2016-10-27: qty 1000

## 2016-10-27 MED ORDER — SCOPOLAMINE 1 MG/3DAYS TD PT72
1.0000 | MEDICATED_PATCH | TRANSDERMAL | Status: DC
  Administered 2016-10-27: 1.5 mg via TRANSDERMAL
  Filled 2016-10-27: qty 1

## 2016-10-27 MED ORDER — SUCCINYLCHOLINE CHLORIDE 200 MG/10ML IV SOSY
PREFILLED_SYRINGE | INTRAVENOUS | Status: AC
  Filled 2016-10-27: qty 10

## 2016-10-27 MED ORDER — MIDAZOLAM HCL 5 MG/5ML IJ SOLN
INTRAMUSCULAR | Status: DC | PRN
  Administered 2016-10-27: 2 mg via INTRAVENOUS

## 2016-10-27 MED ORDER — LIDOCAINE HCL 1 % IJ SOLN
INTRAMUSCULAR | Status: DC | PRN
  Administered 2016-10-27: 10 mL

## 2016-10-27 MED ORDER — LIDOCAINE HCL (CARDIAC) 20 MG/ML IV SOLN
INTRAVENOUS | Status: DC | PRN
  Administered 2016-10-27: 50 mg via INTRAVENOUS

## 2016-10-27 MED ORDER — PROPOFOL 10 MG/ML IV BOLUS
INTRAVENOUS | Status: DC | PRN
  Administered 2016-10-27: 200 mg via INTRAVENOUS

## 2016-10-27 MED ORDER — DEXAMETHASONE SODIUM PHOSPHATE 4 MG/ML IJ SOLN
INTRAMUSCULAR | Status: DC | PRN
  Administered 2016-10-27: 10 mg via INTRAVENOUS

## 2016-10-27 MED ORDER — FENTANYL CITRATE (PF) 100 MCG/2ML IJ SOLN
INTRAMUSCULAR | Status: DC | PRN
  Administered 2016-10-27 (×4): 50 ug via INTRAVENOUS

## 2016-10-27 MED ORDER — ONDANSETRON HCL 4 MG/2ML IJ SOLN
INTRAMUSCULAR | Status: AC
  Filled 2016-10-27: qty 2

## 2016-10-27 MED ORDER — ROCURONIUM BROMIDE 50 MG/5ML IV SOSY
PREFILLED_SYRINGE | INTRAVENOUS | Status: AC
  Filled 2016-10-27: qty 5

## 2016-10-27 SURGICAL SUPPLY — 67 items
ABLATOR ENDOMETRIAL BIPOLAR (ABLATOR) ×4 IMPLANT
BAG URINE DRAINAGE (UROLOGICAL SUPPLIES) ×4 IMPLANT
BENZOIN TINCTURE PRP APPL 2/3 (GAUZE/BANDAGES/DRESSINGS) ×4 IMPLANT
BIPOLAR CUTTING LOOP 21FR (ELECTRODE)
BLADE SURG 11 STRL SS (BLADE) ×4 IMPLANT
CANISTER SUCT 3000ML (MISCELLANEOUS) ×4 IMPLANT
CATH FOLEY 2WAY SLVR  5CC 14FR (CATHETERS) ×2
CATH FOLEY 2WAY SLVR 5CC 14FR (CATHETERS) ×2 IMPLANT
CATH ROBINSON RED A/P 14FR (CATHETERS) IMPLANT
CATH ROBINSON RED A/P 16FR (CATHETERS) ×4 IMPLANT
CLOSURE WOUND 1/4X4 (GAUZE/BANDAGES/DRESSINGS) ×1
COVER BACK TABLE 60X90IN (DRAPES) ×4 IMPLANT
COVER MAYO STAND STRL (DRAPES) ×4 IMPLANT
DERMABOND ADVANCED (GAUZE/BANDAGES/DRESSINGS) ×2
DERMABOND ADVANCED .7 DNX12 (GAUZE/BANDAGES/DRESSINGS) ×2 IMPLANT
DILATOR CANAL MILEX (MISCELLANEOUS) IMPLANT
DRAPE HYSTEROSCOPY (DRAPE) ×4 IMPLANT
DRAPE UNDERBUTTOCKS STRL (DRAPE) ×4 IMPLANT
DRSG COVADERM PLUS 2X2 (GAUZE/BANDAGES/DRESSINGS) ×8 IMPLANT
DRSG OPSITE POSTOP 3X4 (GAUZE/BANDAGES/DRESSINGS) IMPLANT
DURAPREP 26ML APPLICATOR (WOUND CARE) ×4 IMPLANT
ELECT REM PT RETURN 9FT ADLT (ELECTROSURGICAL) ×4
ELECTRODE REM PT RTRN 9FT ADLT (ELECTROSURGICAL) ×2 IMPLANT
GAUZE VASELINE 1X8 (GAUZE/BANDAGES/DRESSINGS) IMPLANT
GLOVE BIO SURGEON STRL SZ 6.5 (GLOVE) ×3 IMPLANT
GLOVE BIO SURGEONS STRL SZ 6.5 (GLOVE) ×1
GLOVE BIOGEL PI IND STRL 7.0 (GLOVE) ×4 IMPLANT
GLOVE BIOGEL PI INDICATOR 7.0 (GLOVE) ×4
GOWN STRL REUS W/TWL LRG LVL3 (GOWN DISPOSABLE) ×8 IMPLANT
HOLDER FOLEY CATH W/STRAP (MISCELLANEOUS) IMPLANT
KIT ROOM TURNOVER WOR (KITS) ×4 IMPLANT
LEGGING LITHOTOMY PAIR STRL (DRAPES) ×4 IMPLANT
LIGASURE 5MM LAPAROSCOPIC (INSTRUMENTS) IMPLANT
LIQUID BAND (GAUZE/BANDAGES/DRESSINGS) ×4 IMPLANT
LOOP CUTTING BIPOLAR 21FR (ELECTRODE) IMPLANT
MANIFOLD NEPTUNE II (INSTRUMENTS) IMPLANT
NEEDLE HYPO 25X1 1.5 SAFETY (NEEDLE) ×4 IMPLANT
NEEDLE INSUFFLATION 120MM (ENDOMECHANICALS) IMPLANT
NS IRRIG 500ML POUR BTL (IV SOLUTION) ×4 IMPLANT
PACK BASIN DAY SURGERY FS (CUSTOM PROCEDURE TRAY) ×4 IMPLANT
PACK LAPAROSCOPY II (CUSTOM PROCEDURE TRAY) ×4 IMPLANT
PAD OB MATERNITY 4.3X12.25 (PERSONAL CARE ITEMS) ×4 IMPLANT
PAD PREP 24X48 CUFFED NSTRL (MISCELLANEOUS) ×4 IMPLANT
PADDING ION DISPOSABLE (MISCELLANEOUS) ×4 IMPLANT
POUCH SPECIMEN RETRIEVAL 10MM (ENDOMECHANICALS) IMPLANT
SET IRRIG TUBING LAPAROSCOPIC (IRRIGATION / IRRIGATOR) IMPLANT
SOLUTION ANTI FOG 6CC (MISCELLANEOUS) ×4 IMPLANT
SOLUTION ELECTROLUBE (MISCELLANEOUS) IMPLANT
STRIP CLOSURE SKIN 1/4X4 (GAUZE/BANDAGES/DRESSINGS) ×3 IMPLANT
SUT MNCRL AB 4-0 PS2 18 (SUTURE) ×4 IMPLANT
SUT MON AB 4-0 PS1 27 (SUTURE) ×4 IMPLANT
SUT VICRYL 0 UR6 27IN ABS (SUTURE) ×4 IMPLANT
SYR 5ML LL (SYRINGE) IMPLANT
SYR CONTROL 10ML LL (SYRINGE) ×4 IMPLANT
SYRINGE 10CC LL (SYRINGE) ×4 IMPLANT
TOWEL OR 17X24 6PK STRL BLUE (TOWEL DISPOSABLE) ×8 IMPLANT
TRAY DSU PREP LF (CUSTOM PROCEDURE TRAY) ×4 IMPLANT
TROCAR BLADELESS OPT 5 100 (ENDOMECHANICALS) ×8 IMPLANT
TROCAR XCEL NON-BLD 11X100MML (ENDOMECHANICALS) IMPLANT
TUBE CONNECTING 12'X1/4 (SUCTIONS)
TUBE CONNECTING 12X1/4 (SUCTIONS) IMPLANT
TUBING AQUILEX INFLOW (TUBING) IMPLANT
TUBING AQUILEX OUTFLOW (TUBING) IMPLANT
TUBING INSUFFLATION 10FT LAP (TUBING) ×4 IMPLANT
WARMER LAPAROSCOPE (MISCELLANEOUS) ×4 IMPLANT
WATER STERILE IRR 1000ML POUR (IV SOLUTION) ×4 IMPLANT
WATER STERILE IRR 500ML POUR (IV SOLUTION) ×4 IMPLANT

## 2016-10-27 NOTE — H&P (Signed)
Gina Lawson is an 25 y.o. female with h/o severe pelvic pain s/p elective termination of pregnancy 10/04/16.  An intrauterine pregnancy was confirmed via office [place] 09/26/16.  Patient has had refractory pain since, subjective fever chills. She was treated last week for presumed endometritis / PID post procedure with 10 day course of doxycycline. This did not alleviate her pain.  She denies abnormal bleeding.   Pertinent Gynecological History: Menses: regular every 28 days without intermenstrual spotting Bleeding: normal Contraception: none DES exposure: denies Blood transfusions: none Sexually transmitted diseases: HPV Previous GYN Procedures: DNC  Last mammogram: n/a  Last pap: abnormal: ASCUS HPV + Date: 09/2016 OB History: G4, P1031   Menstrual History: Menarche age: 30 Patient's last menstrual period was 08/10/2016 (exact date).    Past Medical History:  Diagnosis Date  . Anxiety   . Cancer ([place])   . Fibromyalgia   . Herniated disc   . Vitamin D deficiency     Past Surgical History:  Procedure Laterality Date  . APPENDECTOMY    . CHOLECYSTECTOMY    . FRACTURE SURGERY      History reviewed. No pertinent family history.  Social History:  reports that she has been smoking Cigarettes.  She has a 6.00 pack-year smoking history. She has never used smokeless tobacco. She reports that she drinks alcohol. She reports that she does not use drugs.  Allergies:  Allergies  Allergen Reactions  . Penicillins Nausea And Vomiting    Has patient had a PCN reaction causing immediate rash, facial/tongue/throat swelling, SOB or lightheadedness with hypotension: No Has patient had a PCN reaction causing severe rash involving mucus membranes or skin necrosis: No Has patient had a PCN reaction that required hospitalization No Has patient had a PCN reaction occurring within the last 10 years: Yes If all of the above answers are "NO", then may proceed with Cephalosporin use.   . Tramadol  Other (See Comments)    Light headed - PCP told her not to take it   . Amoxicillin   . Dilaudid [Hydromorphone Hcl]   . Morphine And Related Rash    Prescriptions Prior to Admission  Medication Sig Dispense Refill Last Dose  . cephALEXin (KEFLEX) 500 MG capsule Take 1 capsule (500 mg total) by mouth 4 (four) times daily. 28 capsule 0 Past Month at Unknown time  . HYDROcodone-acetaminophen (NORCO/VICODIN) 5-325 MG tablet Take 1 tablet by mouth every 6 (six) hours as needed for moderate pain or severe pain. 6 tablet 0 Past Month at Unknown time  . oxyCODONE-acetaminophen (PERCOCET/ROXICET) 5-325 MG tablet Take 1 tablet by mouth every 4 (four) hours as needed for severe pain. 8 tablet 0 Past Month at Unknown time  . carbamide peroxide (DEBROX) 6.5 % otic solution Place 5 drops into both ears 2 (two) times daily. (Patient not taking: Reported on 10/20/2016) 15 mL 0 Not Taking at Unknown time  . norgestimate-ethinyl estradiol (SPRINTEC 28) 0.25-35 MG-MCG tablet Take 1 tablet by mouth daily.   More than a month at Unknown time  . ondansetron (ZOFRAN ODT) 4 MG disintegrating tablet 4mg  ODT q4 hours prn nausea/vomit (Patient not taking: Reported on 10/27/2016) 4 tablet 0 More than a month at Unknown time    Review of Systems  Constitutional: Negative for chills and fever.  Gastrointestinal: Positive for abdominal pain.  All other systems reviewed and are negative.   Blood pressure 120/86, pulse (!) 107, temperature 98.4 F (36.9 C), temperature source Oral, resp. rate 14, height 5\' 1"  (  1.549 m), weight 40.8 kg (90 lb), last menstrual period 08/10/2016, SpO2 97 %. Physical Exam  Vitals reviewed. Constitutional: She appears well-developed and well-nourished.  Cardiovascular: Normal rate.   Genitourinary:  Genitourinary Comments: Office exam: Female Genitalia: Vulva: no masses, atrophy, or lesions. Bladder/Urethra: no urethral discharge or mass and normal meatus and bladder non distended. Vagina  no tenderness, erythema, cystocele, rectocele, abnormal vaginal discharge, or vesicle(s) or ulcers. Cervix: no discharge and cervical motion tenderness. Uterus: normal size and shape and midline, mobile, no uterine prolapse, and tender. Adnexa/Parametria: no parametrial tenderness or mass, no ovarian mass, and adnexal tenderness.    Results for orders placed or performed during the hospital encounter of 10/27/16 (from the past 24 hour(s))  Pregnancy, urine POC     Status: Abnormal   Collection Time: 10/27/16  9:43 AM  Result Value Ref Range   Preg Test, Ur POSITIVE (A) NEGATIVE  I-STAT, chem 8     Status: None   Collection Time: 10/27/16 10:19 AM  Result Value Ref Range   Sodium 141 135 - 145 mmol/L   Potassium 3.8 3.5 - 5.1 mmol/L   Chloride 106 101 - 111 mmol/L   BUN 15 6 - 20 mg/dL   Creatinine, Ser 0.60 0.44 - 1.00 mg/dL   Glucose, Bld 93 65 - 99 mg/dL   Calcium, Ion 1.24 1.15 - 1.40 mmol/L   TCO2 21 0 - 100 mmol/L   Hemoglobin 13.3 12.0 - 15.0 g/dL   HCT 39.0 36.0 - 46.0 %    No results found.  Assessment/Plan: 25 yo with pelvic / abdominal pain post elective termination of pregnancy Urine pregnancy test positive today Patient counseled that it is highly unlikely that it was a heterotopic pregnancy (1/30,000) risk. Will perform today a D&C to see if there are any remaining membranes or products of conception.  Will also evaluate the intrauterine cavity with hysteroscopy. If there is nothing seen on hysteroscopy, will go ahead and perform a diagnostic laparoscopy. Discussed with patient the possibility of seeing an ectopic pregnancy, possible uterine perforation from her prior procedure she consents to a salpingectomy if there is concern for ectopic pregnancy.   Risks of the procedure were explained to patient in the office to include uterine perforation, hemorrhage, infection, injury to bowel, bladder and other vessels or organs.    Consent signed and in chart  [place], [place], [place] 10/27/2016, 11:32 AM

## 2016-10-27 NOTE — Anesthesia Preprocedure Evaluation (Addendum)
Anesthesia Evaluation  Patient identified by MRN, date of birth, ID band Patient awake    Reviewed: Allergy & Precautions, NPO status , Patient's Chart, lab work & pertinent test results  Airway Mallampati: I  TM Distance: >3 FB Neck ROM: Full    Dental  (+) Dental Advisory Given   Pulmonary Current Smoker,    breath sounds clear to auscultation       Cardiovascular negative cardio ROS   Rhythm:Regular Rate:Normal     Neuro/Psych negative neurological ROS     GI/Hepatic negative GI ROS, Neg liver ROS,   Endo/Other  negative endocrine ROS  Renal/GU negative Renal ROS     Musculoskeletal  (+) Fibromyalgia -  Abdominal   Peds  Hematology negative hematology ROS (+)   Anesthesia Other Findings   Reproductive/Obstetrics                           Lab Results  Component Value Date   WBC 9.9 10/20/2016   HGB 13.9 10/20/2016   HCT 40.8 10/20/2016   MCV 89.9 10/20/2016   PLT 340 10/20/2016   Lab Results  Component Value Date   CREATININE 0.50 10/20/2016   BUN 7 10/20/2016   NA 141 10/20/2016   K 4.0 10/20/2016   CL 105 10/20/2016   CO2 26 10/20/2016    Anesthesia Physical Anesthesia Plan  ASA: II  Anesthesia Plan: General   Post-op Pain Management:    Induction: Intravenous  Airway Management Planned: Oral ETT  Additional Equipment:   Intra-op Plan:   Post-operative Plan: Extubation in OR  Informed Consent: I have reviewed the patients History and Physical, chart, labs and discussed the procedure including the risks, benefits and alternatives for the proposed anesthesia with the patient or authorized representative who has indicated his/her understanding and acceptance.   Dental advisory given  Plan Discussed with: CRNA  Anesthesia Plan Comments:        Anesthesia Quick Evaluation

## 2016-10-27 NOTE — Transfer of Care (Signed)
  Last Vitals:  Vitals:   10/27/16 0936 10/27/16 1314  BP: 120/86 (!) 110/47  Pulse: (!) 107 86  Resp: 14 14  Temp: 36.9 C 36.1 C    Last Pain:  Vitals:   10/27/16 1000  TempSrc:   PainSc: 7       Patients Stated Pain Goal: 5 (10/27/16 1000)  Immediate Anesthesia Transfer of Care Note  Patient: Gina Lawson  Procedure(s) Performed: Procedure(s) (LRB): HYSTEROSCOPY, DILATATION AND CURETTAGE (N/A) DIAGNOSTIC LAPAROSCOPY (N/A)  Patient Location: PACU  Anesthesia Type: General  Level of Consciousness: awake, alert  and oriented  Airway & Oxygen Therapy: Patient Spontanous Breathing and Patient connected to nasal cannula oxygen  Post-op Assessment: Report given to PACU RN and Post -op Vital signs reviewed and stable  Post vital signs: Reviewed and stable  Complications: No apparent anesthesia complications

## 2016-10-27 NOTE — Op Note (Signed)
OPERATIVE NOTE  Kehlanie Tensley  DOB:    [birth year]  MRN:    [id number]  CSN:    [id number]  Date of Surgery:  10/27/2016  Preoperative Diagnosis: Severe pelvic pain s/p elective termination of pregnancy  Postoperative Diagnosis: Same as above plus possible endometriosis and possible retained POC  Procedure: 1. Dilatation and Curettage 2. Diagnostic Hysteroscopy 3. Diagnostic Laparoscopy   Surgeon: Mady Haagensen. Alwyn Pea, M.D.  Assistant: None  Anesthetic: General    Fluids: 1555mL LR  UOP: 50 mL  EBL: <86mL Catheter: Foley during case   Indication:  Ms. Lucilla Merithew is a 25 y.o. year old female,who presented to my office with severe abdominal pain and subjective fever s/p elective termination of pregnancy at an outside facility.  Intrauterine pregnancy had been confirmed prior to her termination in our office.  Office ultrasound and ER visit CT scan was unremarkable. Despite empiric treatment with antibiotics for suspected endometritis, patient did not have alleviation of her pain.  Risks, benefits, alternatives and indication was explained to patient prior to the procedure.   Findings:  Exam under anesthesia: Normal uterus, mobile anteverted, no palpable masses palpable adnexa: no palpable masses Vagina: normal ruggae, cervix grossly normal  Hysteroscopy: Thickened endometrial lining with polypoid tissue in endometrium, both ostia visualized  Laparoscopy: Normal appearing pelvis. normal appearing uterus, normal bilateral tubes and ovaries. Possible small spot on left ovary which could be endometriosis.  Appendix visualized and normal in appearance. Bowel and liver appeared normal   Procedure:  The patient was taken to the operating room where general anesthesia was found to be adequate. She was placed in dorsal lithotomy position and examined under anesthesia was performed with findings noted above. The patient's abdomen, perineum, and vagina were prepped with sterile solution.  The bladder was drained of urine with a red rubber catheter. A sterile operative graves speculum was placed and the anterior lip of the cervix held with a single-toothed tenaculum.  The cervix was dilated to 19 Fr with Hanks dilators. A 12 degree hysteroscope was introduced into the uterine cavity under direct video visualization and the cavity distended with sterile normal saline. A complete survey was performed with findings noted above. The hysteroscope was removed and a sharp curettage was performed with moderate amount of polypoid tissue evacuated and sent off to Pathology. A Hulka tenaculum was then placed inside the uterus. Surgeon gloves and gown were changed and attention was turned to the abdomen. The subumbilical area was injected with half percent Marcaine with epinephrine. A subumbilical incision was made using an 11 blade scalpel and the peritoneum was entered directly using a 5 mm Excel Visaport with 0 degree 76mm laparoscope attached. Confirmation of entry into the peritoneum was confirmed with opening pressure of CO2 and direct visualization. Pneumoperitoneum was obtained using approximately 2 L of CO2 gas. Patient was placed in steep Trendelenburg and the pelvic organs were inspected with findings as mentioned above. There was no noted injury with placement of the trocar. There was no needed further surgery as there were no adhesions or pathology seen. The pneumoperitoneum was allowed to escape. All instruments were removed.  The skin was reapproximated using 3-0 Monocryl and covered with dermabond.  All instrumentation was removed from the vagina and the cervix noted to be hemostatic. Sponge, needle, instrument counts were correct. The estimated blood loss was less than 10 cc. The patient tolerated her procedure well. She was awakened from her anesthetic without difficulty. She was transported to the recovery room in  stable condition. The endometrial curettings were sent to pathology.   Rogelio Seen S.  Alwyn Pea, M.D.

## 2016-10-27 NOTE — Anesthesia Postprocedure Evaluation (Signed)
Anesthesia Post Note  Patient: [name]  Procedure(s) Performed: Procedure(s) (LRB): HYSTEROSCOPY, DILATATION AND CURETTAGE (N/A) DIAGNOSTIC LAPAROSCOPY (N/A)  Patient location during evaluation: PACU Anesthesia Type: General Level of consciousness: awake and alert Pain management: pain level controlled Vital Signs Assessment: post-procedure vital signs reviewed and stable Respiratory status: spontaneous breathing, nonlabored ventilation, respiratory function stable and patient connected to nasal cannula oxygen Cardiovascular status: blood pressure returned to baseline and stable Postop Assessment: no signs of nausea or vomiting Anesthetic complications: no       Last Vitals:  Vitals:   10/27/16 1330 10/27/16 1345  BP: 100/71 98/70  Pulse: 67 63  Resp: 13 (!) 9  Temp:      Last Pain:  Vitals:   10/27/16 1315  TempSrc:   PainSc: 6                  Gina Lawson

## 2016-10-27 NOTE — Anesthesia Procedure Notes (Signed)
Procedure Name: Intubation Date/Time: 10/27/2016 12:06 PM Performed by: Suzette Battiest Pre-anesthesia Checklist: Patient identified, Emergency Drugs available, Suction available and Patient being monitored Patient Re-evaluated:Patient Re-evaluated prior to inductionOxygen Delivery Method: Circle system utilized Preoxygenation: Pre-oxygenation with 100% oxygen Intubation Type: IV induction Ventilation: Mask ventilation without difficulty Laryngoscope Size: Mac and 3 Grade View: Grade I Tube type: Oral Tube size: 7.0 mm Number of attempts: 1 Airway Equipment and Method: Stylet and LTA kit utilized Placement Confirmation: ETT inserted through vocal cords under direct vision,  positive ETCO2 and breath sounds checked- equal and bilateral Secured at: 21 cm Tube secured with: Tape Dental Injury: Teeth and Oropharynx as per pre-operative assessment

## 2016-10-27 NOTE — Discharge Instructions (Signed)
Laparoscopy, Care After Refer to this sheet in the next few weeks. These instructions provide you with information about caring for yourself after your procedure. Your health care provider may also give you more specific instructions. Your treatment has been planned according to current medical practices, but problems sometimes occur. Call your health care provider if you have any problems or questions after your procedure. What can I expect after the procedure? After the procedure, it is common to have:  A sore throat.  Discomfort in your shoulder.  Mild discomfort or cramping in your abdomen.  Gas pains.  Pain or soreness in the area where the surgical cut (incision) was made.  A bloated feeling.  Tiredness.  Nausea.  Vomiting. Follow these instructions at home: Medicines  Take over-the-counter and prescription medicines only as told by your health care provider.  Do not take aspirin because it can cause bleeding.  Do not drive or operate heavy machinery while taking prescription pain medicine. Activity  Rest for the rest of the day.  Return to your normal activities as told by your health care provider. Ask your health care provider what activities are safe for you. Incision care  Follow instructions from your health care provider about how to take care of your incision. Make sure you:  Wash your hands with soap and water before you change your bandage (dressing). If soap and water are not available, use hand sanitizer.  Change your dressing as told by your health care provider.  Leave stitches (sutures) in place. They may need to stay in place for 2 weeks or longer.  Check your incision area every day for signs of infection. Check for:  More redness, swelling, or pain.  More fluid or blood.  Warmth.  Pus or a bad smell. Other Instructions  Do not take baths, swim, or use a hot tub until your health care provider approves. You may take showers.  Keep all  follow-up visits as told by your health care provider. This is important.  Have someone help you with your daily household tasks for the first few days. Contact a health care provider if:  You have more redness, swelling, or pain around your incision.  Your incision feels warm to the touch.  You have pus or a bad smell coming from your incision.  The edges of your incision break open after the sutures have been removed.  Your pain does not improve after 2-3 days.  You have a rash.  You repeatedly become dizzy or light-headed.  Your pain medicine is not helping.  You are constipated. Get help right away if:  You have a fever.  You faint.  You have increasing pain in your abdomen.  You have severe pain in one or both of your shoulders.  You have fluid or blood coming from your sutures or from your vagina.  You have shortness of breath or difficulty breathing.  You have chest pain or leg pain.  You have ongoing nausea, vomiting, or diarrhea. This information is not intended to replace advice given to you by your health care provider. Make sure you discuss any questions you have with your health care provider. Document Released: 05/16/2005 Document Revised: 03/31/2016 Document Reviewed: 10/07/2015 Elsevier Interactive Patient Education  2017 [employer] [facility] Care Instructions  Activity: Get plenty of rest for the remainder of the day. A responsible adult should stay with you for 24 hours following the procedure.  For the next 24 hours, DO NOT: -  Drive a car -Paediatric nurse -Drink alcoholic beverages -Take any medication unless instructed by your physician -Make any legal decisions or sign important papers.  Meals: Start with liquid foods such as gelatin or soup. Progress to regular foods as tolerated. Avoid greasy, spicy, heavy foods. If nausea and/or vomiting occur, drink only clear liquids until the nausea and/or vomiting subsides. Call your  physician if vomiting continues.  Special Instructions/Symptoms: Your throat may feel dry or sore from the anesthesia or the breathing tube placed in your throat during surgery. If this causes discomfort, gargle with warm salt water. The discomfort should disappear within 24 hours.  If you had a scopolamine patch placed behind your ear for the management of post- operative nausea and/or vomiting:  1. The medication in the patch is effective for 72 hours, after which it should be removed.  Wrap patch in a tissue and discard in the trash. Wash hands thoroughly with soap and water. 2. You may remove the patch earlier than 72 hours if you experience unpleasant side effects which may include dry mouth, dizziness or visual disturbances. 3. Avoid touching the patch. Wash your hands with soap and water after contact with the patch.

## 2016-10-28 ENCOUNTER — Encounter (HOSPITAL_BASED_OUTPATIENT_CLINIC_OR_DEPARTMENT_OTHER): Payer: Self-pay | Admitting: Obstetrics & Gynecology

## 2016-10-31 ENCOUNTER — Encounter (HOSPITAL_COMMUNITY): Payer: Self-pay

## 2016-10-31 ENCOUNTER — Emergency Department (HOSPITAL_COMMUNITY)
Admission: EM | Admit: 2016-10-31 | Discharge: 2016-10-31 | Disposition: A | Discharge status: Home / Self Care | Payer: 59 | Attending: Emergency Medicine | Admitting: Emergency Medicine

## 2016-10-31 ENCOUNTER — Emergency Department (HOSPITAL_COMMUNITY): Payer: 59

## 2016-10-31 DIAGNOSIS — R0781 Pleurodynia: Secondary | ICD-10-CM | POA: Diagnosis present

## 2016-10-31 DIAGNOSIS — R079 Chest pain, unspecified: Secondary | ICD-10-CM

## 2016-10-31 DIAGNOSIS — F17210 Nicotine dependence, cigarettes, uncomplicated: Secondary | ICD-10-CM | POA: Insufficient documentation

## 2016-10-31 LAB — BASIC METABOLIC PANEL
ANION GAP: 7 (ref 5–15)
BUN: 11 mg/dL (ref 6–20)
CHLORIDE: 107 mmol/L (ref 101–111)
CO2: 26 mmol/L (ref 22–32)
Calcium: 9.7 mg/dL (ref 8.9–10.3)
Creatinine, Ser: 0.6 mg/dL (ref 0.44–1.00)
GFR calc Af Amer: >60 mL/min (ref >60)
GFR calc non Af Amer: >60 mL/min (ref >60)
GLUCOSE: 106 mg/dL — AB (ref 65–99)
POTASSIUM: 4.8 mmol/L (ref 3.5–5.1)
Sodium: 140 mmol/L (ref 135–145)

## 2016-10-31 LAB — CBC
HEMATOCRIT: 42.5 % (ref 36.0–46.0)
HEMOGLOBIN: 14.6 g/dL (ref 12.0–15.0)
MCH: 30.6 pg (ref 26.0–34.0)
MCHC: 34.4 g/dL (ref 30.0–36.0)
MCV: 89.1 fL (ref 78.0–100.0)
Platelets: 314 10*3/uL (ref 150–400)
RBC: 4.77 MIL/uL (ref 3.87–5.11)
RDW: 13.3 % (ref 11.5–15.5)
WBC: 10.3 10*3/uL (ref 4.0–10.5)

## 2016-10-31 LAB — I-STAT TROPONIN, ED: Troponin i, poc: 0 ng/mL (ref 0.00–0.08)

## 2016-10-31 LAB — D-DIMER, QUANTITATIVE: D-Dimer, Quant: 0.33 ug/mL-FEU (ref 0.00–0.50)

## 2016-10-31 MED ORDER — OXYCODONE-ACETAMINOPHEN 5-325 MG PO TABS: 1.0000 | ORAL_TABLET | Freq: Four times a day (QID) | ORAL | 0 refills | Status: DC | PRN

## 2016-10-31 MED ORDER — OXYCODONE-ACETAMINOPHEN 5-325 MG PO TABS
1.0000 | ORAL_TABLET | Freq: Once | ORAL | Status: DC
Start: 2016-10-31 — End: 2016-10-31

## 2016-10-31 MED ORDER — ONDANSETRON 4 MG PO TBDP: 4.0000 mg | ORAL_TABLET | Freq: Three times a day (TID) | ORAL | 0 refills | Status: DC | PRN

## 2016-10-31 MED ORDER — ONDANSETRON HCL 4 MG/2ML IJ SOLN
4.0000 mg | Freq: Once | INTRAMUSCULAR | Status: AC
  Administered 2016-10-31: 4 mg via INTRAVENOUS
  Filled 2016-10-31: qty 2

## 2016-10-31 MED ORDER — FENTANYL CITRATE (PF) 100 MCG/2ML IJ SOLN
100.0000 ug | Freq: Once | INTRAMUSCULAR | Status: AC
  Administered 2016-10-31: 100 ug via INTRAVENOUS
  Filled 2016-10-31: qty 2

## 2016-10-31 NOTE — ED Notes (Signed)
Pt stable, understands discharge instructions, and reasons for return.   

## 2016-10-31 NOTE — Discharge Instructions (Signed)
Please schedule appointment with Dr. Ola Spurr at [facility] tomorrow for follow-up. Schedule point with her primary care physician for follow-up in 2-5 days.  Get help right away if: Your chest pain is worse. You have a cough that gets worse, or you cough up blood. You have severe pain in your abdomen. You have severe weakness. You faint. You have sudden, unexplained chest discomfort. You have sudden, unexplained discomfort in your arms, back, neck, or jaw. You have shortness of breath at any time. You suddenly start to sweat, or your skin gets clammy. You feel nauseous or you vomit. You suddenly feel light-headed or dizzy. Your heart begins to beat quickly, or it feels like it is skipping beats.

## 2016-10-31 NOTE — ED Triage Notes (Signed)
Pt presents for evaluation of generalized sharp chest pain since Monday. Pt states has been SOB, pain worse with deep breathing. Pt. States N/V yesterday. Pt. States had laproscopic procedure on Monday for post op complications and believed she was just having gas pain. States has tried medications with no improvement. Pt. States was told by surgeon to come to ED. Pt AxO x4.

## 2016-10-31 NOTE — ED Provider Notes (Signed)
[facility] DEPT Provider Note   CSN: [id number] Arrival date & time: 10/31/16  1136     History   Chief Complaint Chief Complaint  Patient presents with  . Post-op Problem  . Chest Pain    HPI Gina Lawson is a 25 y.o. female with recent history of laparoscopy 4 days ago presents with chest pain since her surgery. Patient describes her pain as worsening pleuritic, lasting throughout the day, sharp and radiating to her back shoulder blades. Patient states that she's never had anything like this before. She states that she spoke with a nurse at the surgeon's office who did her laparoscopy on Monday and referred her to the Ed. Patient also has recent D&C prior to laparoscopy. Patient reports nausea and vomiting yesterday. She admits to shortness of breath secondary to pain from deep inspiration. Patient states that she has been active and walking around since surgery. Patient states that she has tried Imodium and Gas-X yesterday since she thought her symptoms were from gas. Patient was also taking Percocet postsurgery which moderately relieved her symptoms. However she states that she ran out of yesterday and tried ibuprofen today in did not help relief her pain. She denies any fever, chills, increase in respirations, increased work of breathing, leg swelling, immobility.   HPI   Past Medical History:  Diagnosis Date  . Anxiety   . Cancer ([place])   . Fibromyalgia   . Herniated disc   . Vitamin D deficiency     There are no active problems to display for this patient.   Past Surgical History:  Procedure Laterality Date  . APPENDECTOMY    . CHOLECYSTECTOMY    . FRACTURE SURGERY    . HYSTEROSCOPY N/A 10/27/2016   Procedure: HYSTEROSCOPY, DILATATION AND CURETTAGE;  Surgeon: Sanjuana Kava, MD;  Location: [facility];  Service: Gynecology;  Laterality: N/A;  . LAPAROSCOPY N/A 10/27/2016   Procedure: DIAGNOSTIC LAPAROSCOPY;  Surgeon: Sanjuana Kava, MD;  Location: [facility];  Service: Gynecology;  Laterality: N/A;    OB History    No data available       Home Medications    Prior to Admission medications   Medication Sig Start Date End Date Taking? Authorizing Provider  ibuprofen (ADVIL,MOTRIN) 800 MG tablet Take 1 tablet (800 mg total) by mouth every 8 (eight) hours as needed for cramping. 10/27/16  Yes Sanjuana Kava, MD  ondansetron (ZOFRAN ODT) 4 MG disintegrating tablet Take 1 tablet (4 mg total) by mouth every 8 (eight) hours as needed for nausea or vomiting. 10/31/16   Kemper Durie [place], [place]  oxyCODONE-acetaminophen (PERCOCET/ROXICET) 5-325 MG tablet Take 1-2 tablets by mouth every 6 (six) hours as needed for severe pain. 10/31/16   Ahnya Akre Mathews [place], [place]    Family History No family history on file.  Social History Social History  Substance Use Topics  . Smoking status: Current Every Day Smoker    Packs/day: 0.50    Years: 12.00    Types: Cigarettes  . Smokeless tobacco: Never Used  . Alcohol use Yes     Comment: occ     Allergies   Penicillins; Tramadol; Adhesive [tape]; Amoxicillin; Dilaudid [hydromorphone hcl]; and Morphine and related   Review of Systems Review of Systems  Constitutional: Negative for chills and fever.  HENT: Negative for trouble swallowing.   Eyes: Negative for visual disturbance.  Respiratory: Positive for chest tightness and shortness of breath (From pain hurting on deep inspiration.). Negative for cough and  wheezing.   Cardiovascular: Positive for chest pain. Negative for leg swelling.  Gastrointestinal: Positive for abdominal pain (Associated with recent laparoscopy ), nausea (Yesterday) and vomiting (Yesterday. No blood noted. ). Negative for constipation and diarrhea.  Genitourinary: Negative for difficulty urinating and dysuria.  Musculoskeletal: Negative for back pain, gait problem, neck pain and neck stiffness.  Skin: Positive for wound (From recent laparoscopy). Negative for  rash.  Neurological: Negative for speech difficulty and numbness.     Physical Exam Updated Vital Signs BP 110/79   Pulse 77   Temp 98.7 F (37.1 C) (Oral)   Resp 16   Ht 5\' 1"  (1.549 m)   Wt 40.8 kg   LMP 08/10/2016 (Exact Date) Comment: Pt sts she had pregnancy terminated couple of weeks ago  SpO2 100%   BMI 17.01 kg/m   Physical Exam  Constitutional: She is oriented to person, place, and time. She appears well-developed and well-nourished. No distress.  HENT:  Head: Normocephalic and atraumatic.  Nose: Nose normal.  Mouth/Throat: Oropharynx is clear and moist.  Eyes: Conjunctivae and EOM are normal. Pupils are equal, round, and reactive to light.  Neck: Normal range of motion. Neck supple.  Cardiovascular: Normal rate, normal heart sounds and intact distal pulses.   Pulmonary/Chest: Effort normal and breath sounds normal. No respiratory distress. She exhibits no tenderness.  Abdominal: Soft. Bowel sounds are normal. There is tenderness (At incision site). There is no rebound.  Musculoskeletal: Normal range of motion. She exhibits no edema.  Neurological: She is alert and oriented to person, place, and time.  Skin: Skin is warm. Capillary refill takes less than 2 seconds. No rash noted.  Incision site and bellybutton from recent laparoscopy 4 days ago. TTP with no surrounding erythema or visible discharge.  Psychiatric: She has a normal mood and affect. Her behavior is normal.  Nursing note and vitals reviewed.    ED Treatments / Results  Labs (all labs ordered are listed, but only abnormal results are displayed) Labs Reviewed  BASIC METABOLIC PANEL - Abnormal; Notable for the following:       Result Value   Glucose, Bld 106 (*)    All other components within normal limits  CBC  D-DIMER, QUANTITATIVE (NOT AT [facility])  I-STAT TROPOININ, ED    EKG  EKG Interpretation  Date/Time:  Friday October 31 2016 11:41:05 EST Ventricular Rate:  114 PR Interval:  92 QRS  Duration: 80 QT Interval:  310 QTC Calculation: 427 R Axis:   85 Text Interpretation:  Sinus tachycardia with short PR Nonspecific T wave abnormality Abnormal ECG No old tracing to compare Confirmed by CAMPOS  MD, Lennette Bihari ([id number]) on 10/31/2016 10:19:01 PM       Radiology Dg Chest 2 View  Result Date: 10/31/2016 CLINICAL DATA:  Chest pain and shortness of breath and lightheadedness. Recent miscarriage with subsequent laparoscopic evaluation of the uterus. Current smoker. History of malignancy. EXAM: CHEST  2 VIEW COMPARISON:  None in PACs FINDINGS: The lungs are well-expanded and clear. The heart and pulmonary vascularity are normal. The mediastinum is normal in width. There is no pleural effusion. The bony thorax is unremarkable. IMPRESSION: There is no active cardiopulmonary disease. Electronically Signed   By: David  Martinique M.D.   On: 10/31/2016 12:29    Procedures Procedures (including critical care time)  Medications Ordered in ED Medications  ondansetron (ZOFRAN) injection 4 mg (4 mg Intravenous Given 10/31/16 1615)  fentaNYL (SUBLIMAZE) injection 100 mcg (100 mcg Intravenous Given 10/31/16 1616)  Initial Impression / Assessment and Plan / ED Course  I have reviewed the triage vital signs and the nursing notes.  Pertinent labs & imaging results that were available during my care of the patient were reviewed by me and considered in my medical decision making (see chart for details).  Clinical Course   Patient is a 25 year old female complaining of pleuritic chest pain for 4 days. Patient was referred here after speaking on the phone with nurse at her OB/GYN office following up on her surgery. On exam patient is afebrile, vital signs stable, and no apparent distress. Her lung sounds are clear. Abdomen is soft slightly tender especially in the area of incision site from recent laparoscopic surgery 4 days ago. Incision site is TTP however does not appear infectious, no discharge,  and no surrounding erythema. EKG shows no acute findings. Troponin is negative. Basic labs are unremarkable. Chest x-ray data for any acute findings. D-dimer is negative. Low suspicion for ACS and PE at this time. Heart score at 2. Patient given reassurance. Patient agreeable to assessment and plan. Patient afebrile, hemodynamically stable, and in no apparent distress prior to discharge. Patient is not tachypneic, not tachycardic, and oxygen saturation is 100%. Patient given strict instructions to schedule appointment tomorrow to follow up with her OB/GYN. Patient also given strict instructions to follow-up with her primary care physician for this visit. Return precautions given for any new symptoms such as fever, chills, increase respiratory work, respiratory distress, and any leg swelling.  Final Clinical Impressions(s) / ED Diagnoses   Final diagnoses:  Nonspecific chest pain    New Prescriptions Discharge Medication List as of 10/31/2016  5:25 PM    START taking these medications   Details  ondansetron (ZOFRAN ODT) 4 MG disintegrating tablet Take 1 tablet (4 mg total) by mouth every 8 (eight) hours as needed for nausea or vomiting., Starting Fri 10/31/2016, [place], [place] 10/31/16 2219    Jola Schmidt, MD 10/31/16 2220

## 2016-11-18 ENCOUNTER — Other Ambulatory Visit: Payer: Self-pay | Admitting: Obstetrics & Gynecology

## 2017-02-12 ENCOUNTER — Emergency Department (HOSPITAL_COMMUNITY)
Admission: EM | Admit: 2017-02-12 | Discharge: 2017-02-12 | Disposition: A | Discharge status: Home / Self Care | Payer: Medicaid Other | Attending: Emergency Medicine | Admitting: Emergency Medicine

## 2017-02-12 ENCOUNTER — Encounter (HOSPITAL_COMMUNITY): Payer: Self-pay | Admitting: *Deleted

## 2017-02-12 ENCOUNTER — Emergency Department (HOSPITAL_COMMUNITY): Payer: Medicaid Other

## 2017-02-12 DIAGNOSIS — R112 Nausea with vomiting, unspecified: Secondary | ICD-10-CM | POA: Diagnosis not present

## 2017-02-12 DIAGNOSIS — N739 Female pelvic inflammatory disease, unspecified: Secondary | ICD-10-CM | POA: Diagnosis not present

## 2017-02-12 DIAGNOSIS — R109 Unspecified abdominal pain: Secondary | ICD-10-CM | POA: Diagnosis present

## 2017-02-12 DIAGNOSIS — N939 Abnormal uterine and vaginal bleeding, unspecified: Secondary | ICD-10-CM

## 2017-02-12 DIAGNOSIS — R197 Diarrhea, unspecified: Secondary | ICD-10-CM | POA: Insufficient documentation

## 2017-02-12 DIAGNOSIS — Z87891 Personal history of nicotine dependence: Secondary | ICD-10-CM | POA: Diagnosis not present

## 2017-02-12 DIAGNOSIS — Z79899 Other long term (current) drug therapy: Secondary | ICD-10-CM | POA: Diagnosis not present

## 2017-02-12 DIAGNOSIS — R102 Pelvic and perineal pain: Secondary | ICD-10-CM | POA: Diagnosis not present

## 2017-02-12 DIAGNOSIS — Z859 Personal history of malignant neoplasm, unspecified: Secondary | ICD-10-CM | POA: Diagnosis not present

## 2017-02-12 DIAGNOSIS — N730 Acute parametritis and pelvic cellulitis: Secondary | ICD-10-CM

## 2017-02-12 LAB — CBC
HEMATOCRIT: 41.2 % (ref 36.0–46.0)
HEMOGLOBIN: 13.6 g/dL (ref 12.0–15.0)
MCH: 30.4 pg (ref 26.0–34.0)
MCHC: 33 g/dL (ref 30.0–36.0)
MCV: 92 fL (ref 78.0–100.0)
PLATELETS: 386 10*3/uL (ref 150–400)
RBC: 4.48 MIL/uL (ref 3.87–5.11)
RDW: 13.3 % (ref 11.5–15.5)
WBC: 8.3 10*3/uL (ref 4.0–10.5)

## 2017-02-12 LAB — BASIC METABOLIC PANEL
ANION GAP: 8 (ref 5–15)
BUN: 17 mg/dL (ref 6–20)
CO2: 27 mmol/L (ref 22–32)
CREATININE: 0.67 mg/dL (ref 0.44–1.00)
Calcium: 9.7 mg/dL (ref 8.9–10.3)
Chloride: 104 mmol/L (ref 101–111)
Glucose, Bld: 93 mg/dL (ref 65–99)
Potassium: 4.2 mmol/L (ref 3.5–5.1)
Sodium: 139 mmol/L (ref 135–145)

## 2017-02-12 LAB — URINALYSIS, ROUTINE W REFLEX MICROSCOPIC
BILIRUBIN URINE: NEGATIVE
GLUCOSE, UA: NEGATIVE mg/dL
HGB URINE DIPSTICK: NEGATIVE
KETONES UR: NEGATIVE mg/dL
Leukocytes, UA: NEGATIVE
Nitrite: NEGATIVE
PROTEIN: NEGATIVE mg/dL
Specific Gravity, Urine: 1.016 (ref 1.005–1.030)
pH: 7 (ref 5.0–8.0)

## 2017-02-12 LAB — WET PREP, GENITAL
Clue Cells Wet Prep HPF POC: NONE SEEN
SPERM: NONE SEEN
Trich, Wet Prep: NONE SEEN
Yeast Wet Prep HPF POC: NONE SEEN

## 2017-02-12 LAB — HCG, QUANTITATIVE, PREGNANCY: hCG, Beta Chain, Quant, S: 2 m[IU]/mL (ref <5)

## 2017-02-12 LAB — POC URINE PREG, ED: Preg Test, Ur: NEGATIVE

## 2017-02-12 MED ORDER — DOXYCYCLINE HYCLATE 100 MG PO CAPS: 100.0000 mg | ORAL_CAPSULE | Freq: Two times a day (BID) | ORAL | 0 refills | Status: AC

## 2017-02-12 MED ORDER — OXYCODONE-ACETAMINOPHEN 7.5-325 MG PO TABS
1.0000 | ORAL_TABLET | Freq: Once | ORAL | Status: AC
  Administered 2017-02-12: 1 via ORAL
  Filled 2017-02-12: qty 1

## 2017-02-12 MED ORDER — SODIUM CHLORIDE 0.9 % IV BOLUS (SEPSIS)
1000.0000 mL | Freq: Once | INTRAVENOUS | Status: AC
  Administered 2017-02-12: 1000 mL via INTRAVENOUS

## 2017-02-12 MED ORDER — ONDANSETRON HCL 4 MG/2ML IJ SOLN
4.0000 mg | Freq: Once | INTRAMUSCULAR | Status: AC
  Administered 2017-02-12: 4 mg via INTRAVENOUS
  Filled 2017-02-12: qty 2

## 2017-02-12 MED ORDER — CEFTRIAXONE SODIUM 250 MG IJ SOLR
250.0000 mg | Freq: Once | INTRAMUSCULAR | Status: AC
  Administered 2017-02-12: 250 mg via INTRAMUSCULAR
  Filled 2017-02-12: qty 250

## 2017-02-12 MED ORDER — METOCLOPRAMIDE HCL 5 MG/ML IJ SOLN
10.0000 mg | Freq: Once | INTRAMUSCULAR | Status: AC
  Administered 2017-02-12: 10 mg via INTRAVENOUS
  Filled 2017-02-12: qty 2

## 2017-02-12 MED ORDER — DOXYCYCLINE HYCLATE 100 MG PO TABS
100.0000 mg | ORAL_TABLET | Freq: Once | ORAL | Status: AC
  Administered 2017-02-12: 100 mg via ORAL
  Filled 2017-02-12: qty 1

## 2017-02-12 MED ORDER — LIDOCAINE HCL (PF) 1 % IJ SOLN
INTRAMUSCULAR | Status: AC
  Administered 2017-02-12: 5 mL
  Filled 2017-02-12: qty 5

## 2017-02-12 NOTE — ED Notes (Signed)
Taken to ultrasound

## 2017-02-12 NOTE — ED Provider Notes (Signed)
[facility] DEPT Provider Note   CSN: [id number] Arrival date & time: 02/12/17  1357     History   Chief Complaint Chief Complaint  Patient presents with  . Abdominal Pain  . Diarrhea  . Emesis    HPI Gina Lawson is a 26 y.o. female PMH fibromyalgia, endometriosis, PID presents for N/V and fever x1 day. Pt reports miscarriage with large clots one week ago. She states she missed her period, had a positive pregnancy test and then started bleeding five days later. Bleeding started 3/28 and lasted about six days. Lower abdominal cramping has waxed and waned for the past week. Bleeding has decreased and she reports light spotting. Fiance sick with fever, N/V/D. His symptoms resolved yesterday. Pt developed fever 101 yesterday evening and woke up with N/V around 0500 this morning. Unable to tolerate any PO intake. Surgical hx lap appendectomy and cholecystectomy. LMP 2/18.  The history is provided by the patient and medical records.    Past Medical History:  Diagnosis Date  . Anxiety   . Cancer ([place])   . Fibromyalgia   . Herniated disc   . Vitamin D deficiency     There are no active problems to display for this patient.   Past Surgical History:  Procedure Laterality Date  . APPENDECTOMY    . CHOLECYSTECTOMY    . FRACTURE SURGERY    . HYSTEROSCOPY N/A 10/27/2016   Procedure: HYSTEROSCOPY, DILATATION AND CURETTAGE;  Surgeon: Sanjuana Kava, MD;  Location: [facility];  Service: Gynecology;  Laterality: N/A;  . LAPAROSCOPY N/A 10/27/2016   Procedure: DIAGNOSTIC LAPAROSCOPY;  Surgeon: Sanjuana Kava, MD;  Location: [facility];  Service: Gynecology;  Laterality: N/A;    OB History    No data available       Home Medications    Prior to Admission medications   Medication Sig Start Date End Date Taking? Authorizing Provider  doxycycline (VIBRAMYCIN) 100 MG capsule Take 1 capsule (100 mg total) by mouth 2 (two) times daily. 02/12/17 02/26/17   Monico Blitz, MD  ibuprofen (ADVIL,MOTRIN) 800 MG tablet Take 1 tablet (800 mg total) by mouth every 8 (eight) hours as needed for cramping. 10/27/16   Sanjuana Kava, MD  ondansetron (ZOFRAN ODT) 4 MG disintegrating tablet Take 1 tablet (4 mg total) by mouth every 8 (eight) hours as needed for nausea or vomiting. 10/31/16   Kemper Durie [place], [place]  oxyCODONE-acetaminophen (PERCOCET/ROXICET) 5-325 MG tablet Take 1-2 tablets by mouth every 6 (six) hours as needed for severe pain. 10/31/16   Francisco Mathews [place], [place]    Family History No family history on file.  Social History Social History  Substance Use Topics  . Smoking status: Former Smoker    Packs/day: 0.50    Years: 12.00    Types: Cigarettes  . Smokeless tobacco: Never Used  . Alcohol use Yes     Comment: occ     Allergies   Penicillins; Tramadol; Adhesive [tape]; Amoxicillin; Dilaudid [hydromorphone hcl]; and Morphine and related   Review of Systems Review of Systems  Constitutional: Positive for chills and fever.  HENT: Negative for sore throat.   Respiratory: Negative for cough and shortness of breath.   Cardiovascular: Negative for chest pain and palpitations.  Gastrointestinal: Positive for abdominal pain, diarrhea, nausea and vomiting.  Genitourinary: Positive for pelvic pain, vaginal bleeding and vaginal pain. Negative for dysuria and hematuria.  Musculoskeletal: Positive for myalgias. Negative for back pain.  Skin: Negative for rash.  Neurological: Positive for light-headedness. Negative for headaches.     Physical Exam Updated Vital Signs BP 100/63 (BP Location: Right Arm)   Pulse 66   Temp 98.6 F (37 C) (Oral)   Resp 14   SpO2 97%   Physical Exam  Constitutional: She is oriented to person, place, and time. She appears well-developed and well-nourished. No distress.  HENT:  Head: Normocephalic and atraumatic.  Mouth/Throat: Oropharynx is clear and moist.  Eyes: Conjunctivae and  EOM are normal.  Neck: Normal range of motion. Neck supple.  Cardiovascular: Normal rate and regular rhythm.   No murmur heard. Pulmonary/Chest: Effort normal and breath sounds normal. No respiratory distress.  Abdominal: Soft. There is tenderness in the suprapubic area. There is no rigidity, no rebound and no CVA tenderness.  Genitourinary: Pelvic exam was performed with patient supine. Uterus is not enlarged. Cervix exhibits motion tenderness and discharge. Cervix exhibits no friability. Right adnexum displays no fullness. Left adnexum displays no fullness. No bleeding in the vagina. Vaginal discharge found.  Musculoskeletal: Normal range of motion. She exhibits no edema.  Neurological: She is alert and oriented to person, place, and time.  Skin: Skin is warm and dry. Capillary refill takes less than 2 seconds. No rash noted.  Psychiatric: She has a normal mood and affect.  Nursing note and vitals reviewed.    ED Treatments / Results  Labs (all labs ordered are listed, but only abnormal results are displayed) Labs Reviewed  WET PREP, GENITAL - Abnormal; Notable for the following:       Result Value   WBC, Wet Prep HPF POC FEW (*)    All other components within normal limits  URINALYSIS, ROUTINE W REFLEX MICROSCOPIC - Abnormal; Notable for the following:    APPearance CLOUDY (*)    All other components within normal limits  CBC  BASIC METABOLIC PANEL  HCG, QUANTITATIVE, PREGNANCY  POC URINE PREG, ED  GC/CHLAMYDIA PROBE AMP ([place]) NOT AT [facility]    EKG  EKG Interpretation None       Radiology US Ob Comp Less 14 Wks  Result Date: 02/12/2017 CLINICAL DATA:  Lower abdominal pain and vaginal bleeding. Gestational age by LMP of 7 weeks 0 days. EXAM: OBSTETRIC <14 WK [place] AND TRANSVAGINAL OB US TECHNIQUE: Both transabdominal and transvaginal ultrasound examinations were performed for complete evaluation of the gestation as well as the maternal uterus, adnexal regions, and pelvic  cul-de-sac. Transvaginal technique was performed to assess early pregnancy. COMPARISON:  None. FINDINGS: Intrauterine gestational sac: Probable early single intrauterine gestational sac Yolk sac:  Not Visualized. Embryo:  Not Visualized. MSD: 3  mm   5 w   0  d Subchorionic hemorrhage:  None visualized. Maternal uterus/adnexae: Both ovaries are normal in appearance. No mass or free fluid identified. IMPRESSION: Probable early approximately 5 week intrauterine gestational sac, but no yolk sac, fetal pole, or cardiac activity yet visualized. Recommend follow-up quantitative B-HCG levels and follow-up US in 14 days to assess viability. This recommendation follows SRU consensus guidelines: Diagnostic Criteria for Nonviable Pregnancy Early in the First Trimester. [employer] Med 2013; 793:9030-09. Electronically Signed   By: Earle Gell M.D.   On: 02/12/2017 17:55   US Ob Transvaginal  Result Date: 02/12/2017 CLINICAL DATA:  Lower abdominal pain and vaginal bleeding. Gestational age by LMP of 7 weeks 0 days. EXAM: OBSTETRIC <14 WK [place] AND TRANSVAGINAL OB US TECHNIQUE: Both transabdominal and transvaginal ultrasound examinations were performed for complete evaluation of the  gestation as well as the maternal uterus, adnexal regions, and pelvic cul-de-sac. Transvaginal technique was performed to assess early pregnancy. COMPARISON:  None. FINDINGS: Intrauterine gestational sac: Probable early single intrauterine gestational sac Yolk sac:  Not Visualized. Embryo:  Not Visualized. MSD: 3  mm   5 w   0  d Subchorionic hemorrhage:  None visualized. Maternal uterus/adnexae: Both ovaries are normal in appearance. No mass or free fluid identified. IMPRESSION: Probable early approximately 5 week intrauterine gestational sac, but no yolk sac, fetal pole, or cardiac activity yet visualized. Recommend follow-up quantitative B-HCG levels and follow-up US in 14 days to assess viability. This recommendation follows SRU consensus  guidelines: Diagnostic Criteria for Nonviable Pregnancy Early in the First Trimester. [employer] Med 2013; 825:0539-76. Electronically Signed   By: Earle Gell M.D.   On: 02/12/2017 17:55    Procedures Procedures (including critical care time)  Medications Ordered in ED Medications  sodium chloride 0.9 % bolus 1,000 mL (0 mLs Intravenous Stopped 02/12/17 1710)  ondansetron (ZOFRAN) injection 4 mg (4 mg Intravenous Given 02/12/17 1536)  oxyCODONE-acetaminophen (PERCOCET) 7.5-325 MG per tablet 1 tablet (1 tablet Oral Given 02/12/17 1657)  metoCLOPramide (REGLAN) injection 10 mg (10 mg Intravenous Given 02/12/17 1809)  cefTRIAXone (ROCEPHIN) injection 250 mg (250 mg Intramuscular Given 02/12/17 1925)  doxycycline (VIBRA-TABS) tablet 100 mg (100 mg Oral Given 02/12/17 1925)  lidocaine (PF) (XYLOCAINE) 1 % injection (5 mLs  Given 02/12/17 1925)     Initial Impression / Assessment and Plan / ED Course  I have reviewed the triage vital signs and the nursing notes.  Pertinent labs & imaging results that were available during my care of the patient were reviewed by me and considered in my medical decision making (see chart for details).    26 y.o. female presents for lower abdominal pain, N/V/D x1 day. AF, VSS. Non-toxic appearing. Diffusely tender lower abdomen. Pelvic exam with copious discharge and CMT. Given recent reported pregnancy will obtain ultrasound to r/o retained products. Given IVF and anti-emetic. Tolerating PO intake in ED. - Serum HCG negative - Wet prep negative. GC/Chlamydia in process - Transvaginal ultrasound read by radiology states early intrauterine gestational sac w/o yolk sac, fetal pole or cardiac activity. OB/Gyn reviewed imaging and state no gestational sac, normal appearing uterus. Will treat empirically for PID as out-patient. Return precautions given. Pt voiced understanding and agreement with plan.    Final Clinical Impressions(s) / ED Diagnoses   Final diagnoses:  PID  (acute pelvic inflammatory disease)  Nausea vomiting and diarrhea    New Prescriptions Discharge Medication List as of 02/12/2017  7:21 PM    START taking these medications   Details  doxycycline (VIBRAMYCIN) 100 MG capsule Take 1 capsule (100 mg total) by mouth 2 (two) times daily., Starting Thu 02/12/2017, Until Thu 02/26/2017, Print         Monico Blitz, MD 02/12/17 Lattie Corns    Merrily Pew, MD 02/12/17 2102

## 2017-02-12 NOTE — ED Notes (Signed)
Pt ambulated to room from waiting area, pt stated she could not give a urine sample at this time.

## 2017-02-12 NOTE — ED Notes (Signed)
Pelvic cart at bedside. 

## 2017-02-12 NOTE — ED Triage Notes (Signed)
Pt states she is here with abdominal pain above umbilicus and having vomiting and diarrhea since O500.  Pt states she had miscarriage on 02/04/17 and is still having some spotting.  Pt states the abdominal pain has been there since then

## 2017-02-13 LAB — GC/CHLAMYDIA PROBE AMP (~~LOC~~) NOT AT ARMC
Chlamydia: NEGATIVE
NEISSERIA GONORRHEA: NEGATIVE

## 2017-03-28 ENCOUNTER — Ambulatory Visit (HOSPITAL_COMMUNITY)
Admission: EM | Admit: 2017-03-28 | Discharge: 2017-03-28 | Disposition: A | Discharge status: Home / Self Care | Payer: Medicaid Other | Attending: Internal Medicine | Admitting: Internal Medicine

## 2017-03-28 ENCOUNTER — Encounter (HOSPITAL_COMMUNITY): Payer: Self-pay | Admitting: Emergency Medicine

## 2017-03-28 DIAGNOSIS — R42 Dizziness and giddiness: Secondary | ICD-10-CM

## 2017-03-28 DIAGNOSIS — K2960 Other gastritis without bleeding: Secondary | ICD-10-CM

## 2017-03-28 LAB — POCT URINALYSIS DIP (DEVICE)
Bilirubin Urine: NEGATIVE
Glucose, UA: NEGATIVE mg/dL
HGB URINE DIPSTICK: NEGATIVE
LEUKOCYTES UA: NEGATIVE
NITRITE: NEGATIVE
PROTEIN: NEGATIVE mg/dL
SPECIFIC GRAVITY, URINE: 1.02 (ref 1.005–1.030)
UROBILINOGEN UA: 0.2 mg/dL (ref 0.0–1.0)
pH: 8.5 — ABNORMAL HIGH (ref 5.0–8.0)

## 2017-03-28 LAB — POCT PREGNANCY, URINE: Preg Test, Ur: NEGATIVE

## 2017-03-28 MED ORDER — PROPRANOLOL HCL 10 MG PO TABS: 10.0000 mg | ORAL_TABLET | Freq: Four times a day (QID) | ORAL | 0 refills | Status: DC | PRN

## 2017-03-28 MED ORDER — OMEPRAZOLE 40 MG PO CPDR: 40.0000 mg | DELAYED_RELEASE_CAPSULE | Freq: Two times a day (BID) | ORAL | 1 refills | Status: DC

## 2017-03-28 MED ORDER — ONDANSETRON HCL 4 MG PO TABS: 8.0000 mg | ORAL_TABLET | ORAL | 0 refills | Status: DC | PRN

## 2017-03-28 NOTE — ED Triage Notes (Signed)
Pt c/o constant light headedness/dizziness, abd pain onset 1 week associated w/GERD  Was recently put on Prozac and hydroxyzine for panic attacks... Reports she stopped taking both of them 6 days ago after being on them x2 weeks  LMP = 03/27/17... Took 2 preg tests yest and both were negative  A&O x4... NAD... Ambulatory

## 2017-03-28 NOTE — Discharge Instructions (Addendum)
Dizziness seems most likely to be due to adrenaline overload.  Other possible contributors are poor sleep, not eating well, mild sinus congestion.  Energy supplements, weight loss meds, decongestants can all aggravate dizziness and adrenaline related symptoms.  Prescription for propranolol, a beta blocker, to help decrease adrenaline effect, was sent to the pharmacy to take as needed.  Stomach upset sounds like a stomach acid problem, often aggravated by life stresses and adrenaline overload.  Prescription for ondansetron for nausea and omeprazole for stomach acid were sent to the pharmacy.

## 2017-03-28 NOTE — ED Provider Notes (Signed)
[facility]    CSN: [id number] Arrival date & time: 03/28/17  1440     History   Chief Complaint Chief Complaint  Patient presents with  . Dizziness    HPI Gina Lawson is a 26 y.o. female. She has a long history with anxiety, and has some increased life stressors recently. In the middle of a custody issue. Recently started Prozac and hydroxyzine for panic symptoms, but stopped them. She felt they weren't helpful, and wasn't sure if they were making her symptoms worse. A lot of difficulty with upper GI distress, nausea, some vomiting. Burning epigastric discomfort.  Feels a little dizzy/off balance, but not falling down.    HPI  Past Medical History:  Diagnosis Date  . Anxiety   . Cancer ([place])   . Fibromyalgia   . Herniated disc   . Vitamin D deficiency     Past Surgical History:  Procedure Laterality Date  . APPENDECTOMY    . CHOLECYSTECTOMY    . FRACTURE SURGERY    . HYSTEROSCOPY N/A 10/27/2016   Procedure: HYSTEROSCOPY, DILATATION AND CURETTAGE;  Surgeon: Sanjuana Kava, MD;  Location: [facility];  Service: Gynecology;  Laterality: N/A;  . LAPAROSCOPY N/A 10/27/2016   Procedure: DIAGNOSTIC LAPAROSCOPY;  Surgeon: Sanjuana Kava, MD;  Location: [facility];  Service: Gynecology;  Laterality: N/A;     Home Medications    Prior to Admission medications   Medication Sig Start Date End Date Taking? Authorizing Provider  oxyCODONE-acetaminophen (PERCOCET/ROXICET) 5-325 MG tablet Take 1-2 tablets by mouth every 6 (six) hours as needed for severe pain. 10/31/16  Yes [place], [place], [place]  ibuprofen (ADVIL,MOTRIN) 800 MG tablet Take 1 tablet (800 mg total) by mouth every 8 (eight) hours as needed for cramping. 10/27/16   Sanjuana Kava, MD  omeprazole (PRILOSEC) 40 MG capsule Take 1 capsule (40 mg total) by mouth 2 (two) times daily before a meal. 03/28/17 04/12/17  [name], MD  ondansetron (ZOFRAN ODT) 4 MG disintegrating tablet  Take 1 tablet (4 mg total) by mouth every 8 (eight) hours as needed for nausea or vomiting. 10/31/16   Espina, Kemper Durie, PA  ondansetron (ZOFRAN) 4 MG tablet Take 2 tablets (8 mg total) by mouth every 4 (four) hours as needed for nausea or vomiting. 03/28/17   [name], MD  propranolol (INDERAL) 10 MG tablet Take 1 tablet (10 mg total) by mouth 4 (four) times daily as needed. Can take up to 2 tablets 4 times daily as needed. 03/28/17 04/07/17  [name], MD    Family History History reviewed. No pertinent family history.  Social History Social History  Substance Use Topics  . Smoking status: Former Smoker    Packs/day: 0.50    Years: 12.00    Types: Cigarettes  . Smokeless tobacco: Never Used  . Alcohol use Yes     Comment: occ     Allergies   Penicillins; Tramadol; Adhesive [tape]; Amoxicillin; Dilaudid [hydromorphone hcl]; and Morphine and related   Review of Systems Review of Systems  All other systems reviewed and are negative.    Physical Exam Triage Vital Signs ED Triage Vitals [03/28/17 1511]  Enc Vitals Group     BP 131/86     Pulse Rate 83     Resp 20     Temp 98.5 F (36.9 C)     Temp Source Oral     SpO2 100 %     Weight  Height      Pain Score      Pain Loc    Orthostatic VS for the past 24 hrs:  BP- Lying Pulse- Lying BP- Sitting Pulse- Sitting BP- Standing at 0 minutes Pulse- Standing at 0 minutes  03/28/17 1527 (!) 143/91 55 163/90 55 (!) 150/92 58  --not orthostatic  Updated Vital Signs BP 131/86 (BP Location: Right Arm)   Pulse 83   Temp 98.5 F (36.9 C) (Oral)   Resp 20   LMP 03/27/2017   SpO2 100%   Physical Exam  Constitutional: She is oriented to person, place, and time. No distress.  HENT:  Head: Atraumatic.  Bilateral TMs mildly dull, no erythema Mild to moderate nasal congestion bilaterally  Eyes:  Conjugate gaze observed, no eye redness/discharge  Neck: Neck supple.  Cardiovascular: Normal rate and  regular rhythm.   Pulmonary/Chest: No respiratory distress. She has no wheezes. She has no rales.  Lungs clear, symmetric breath sounds  Abdominal: Soft. She exhibits no distension. There is no rebound and no guarding.  Musculoskeletal: Normal range of motion.  Neurological: She is alert and oriented to person, place, and time.  Face is symmetric, speech is clear/coherent. Able to walk into the urgent care independently, and climb on/off the exam table. Able to lie down/sit up without assistance.  Skin: Skin is warm and dry.  Nursing note and vitals reviewed.    UC Treatments / Results  Labs Results for orders placed or performed during the hospital encounter of 03/28/17  POCT urinalysis dip (device)  Result Value Ref Range   Glucose, UA NEGATIVE NEGATIVE mg/dL   Bilirubin Urine NEGATIVE NEGATIVE   Ketones, ur TRACE (A) NEGATIVE mg/dL   Specific Gravity, Urine 1.020 1.005 - 1.030   Hgb urine dipstick NEGATIVE NEGATIVE   pH 8.5 (H) 5.0 - 8.0   Protein, ur NEGATIVE NEGATIVE mg/dL   Urobilinogen, UA 0.2 0.0 - 1.0 mg/dL   Nitrite NEGATIVE NEGATIVE   Leukocytes, UA NEGATIVE NEGATIVE  Pregnancy, urine POC  Result Value Ref Range   Preg Test, Ur NEGATIVE NEGATIVE    Procedures Procedures (including critical care time) None today  Final Clinical Impressions(s) / UC Diagnoses   Final diagnoses:  Dizziness  Reflux gastritis   Dizziness seems most likely to be due to adrenaline overload.  Other possible contributors are poor sleep, not eating well, mild sinus congestion.  Energy supplements, weight loss meds, decongestants can all aggravate dizziness and adrenaline related symptoms.  Prescription for propranolol, a beta blocker, to help decrease adrenaline effect, was sent to the pharmacy to take as needed.  Stomach upset sounds like a stomach acid problem, often aggravated by life stresses and adrenaline overload.  Prescription for ondansetron for nausea and omeprazole for stomach  acid were sent to the pharmacy.    New Prescriptions Discharge Medication List as of 03/28/2017  4:27 PM    START taking these medications   Details  omeprazole (PRILOSEC) 40 MG capsule Take 1 capsule (40 mg total) by mouth 2 (two) times daily before a meal., Starting Sat 03/28/2017, Until Sun 04/12/2017, Normal    ondansetron (ZOFRAN) 4 MG tablet Take 2 tablets (8 mg total) by mouth every 4 (four) hours as needed for nausea or vomiting., Starting Sat 03/28/2017, Normal    propranolol (INDERAL) 10 MG tablet Take 1 tablet (10 mg total) by mouth 4 (four) times daily as needed. Can take up to 2 tablets 4 times daily as needed., Starting Sat 03/28/2017, Until  Tue 04/07/2017, Normal         [name], MD 03/28/17 2132

## 2017-03-28 NOTE — ED Notes (Signed)
Updated on wait.  Comfort measures offered and provided.

## 2017-04-02 ENCOUNTER — Emergency Department (HOSPITAL_COMMUNITY)
Admission: EM | Admit: 2017-04-02 | Discharge: 2017-04-02 | Disposition: A | Discharge status: Home / Self Care | Payer: Medicaid Other | Attending: Emergency Medicine | Admitting: Emergency Medicine

## 2017-04-02 ENCOUNTER — Encounter (HOSPITAL_COMMUNITY): Payer: Self-pay

## 2017-04-02 ENCOUNTER — Emergency Department (HOSPITAL_COMMUNITY): Payer: Medicaid Other

## 2017-04-02 DIAGNOSIS — R079 Chest pain, unspecified: Secondary | ICD-10-CM

## 2017-04-02 DIAGNOSIS — R0602 Shortness of breath: Secondary | ICD-10-CM

## 2017-04-02 DIAGNOSIS — I10 Essential (primary) hypertension: Secondary | ICD-10-CM | POA: Insufficient documentation

## 2017-04-02 DIAGNOSIS — F419 Anxiety disorder, unspecified: Secondary | ICD-10-CM

## 2017-04-02 DIAGNOSIS — Z87891 Personal history of nicotine dependence: Secondary | ICD-10-CM | POA: Diagnosis not present

## 2017-04-02 DIAGNOSIS — R0789 Other chest pain: Secondary | ICD-10-CM | POA: Insufficient documentation

## 2017-04-02 DIAGNOSIS — Z79899 Other long term (current) drug therapy: Secondary | ICD-10-CM | POA: Diagnosis not present

## 2017-04-02 HISTORY — DX: Essential (primary) hypertension: I10

## 2017-04-02 LAB — D-DIMER, QUANTITATIVE: D-Dimer, Quant: <0.27 ug/mL-FEU (ref 0.00–0.50)

## 2017-04-02 LAB — BASIC METABOLIC PANEL
ANION GAP: 7 (ref 5–15)
BUN: 8 mg/dL (ref 6–20)
CALCIUM: 9.1 mg/dL (ref 8.9–10.3)
CO2: 27 mmol/L (ref 22–32)
Chloride: 105 mmol/L (ref 101–111)
Creatinine, Ser: 0.62 mg/dL (ref 0.44–1.00)
GFR calc Af Amer: >60 mL/min (ref >60)
GFR calc non Af Amer: >60 mL/min (ref >60)
Glucose, Bld: 106 mg/dL — ABNORMAL HIGH (ref 65–99)
Potassium: 4.3 mmol/L (ref 3.5–5.1)
Sodium: 139 mmol/L (ref 135–145)

## 2017-04-02 LAB — CBC
HCT: 40.3 % (ref 36.0–46.0)
Hemoglobin: 13.4 g/dL (ref 12.0–15.0)
MCH: 30.3 pg (ref 26.0–34.0)
MCHC: 33.3 g/dL (ref 30.0–36.0)
MCV: 91.2 fL (ref 78.0–100.0)
Platelets: 259 10*3/uL (ref 150–400)
RBC: 4.42 MIL/uL (ref 3.87–5.11)
RDW: 13.5 % (ref 11.5–15.5)
WBC: 6.5 10*3/uL (ref 4.0–10.5)

## 2017-04-02 LAB — I-STAT TROPONIN, ED: TROPONIN I, POC: 0 ng/mL (ref 0.00–0.08)

## 2017-04-02 LAB — TSH: TSH: 0.311 u[IU]/mL — ABNORMAL LOW (ref 0.350–4.500)

## 2017-04-02 MED ORDER — LORAZEPAM 1 MG PO TABS
0.5000 mg | ORAL_TABLET | Freq: Once | ORAL | Status: AC
  Administered 2017-04-02: 0.5 mg via ORAL
  Filled 2017-04-02: qty 1

## 2017-04-02 MED ORDER — ONDANSETRON 4 MG PO TBDP
4.0000 mg | ORAL_TABLET | Freq: Once | ORAL | Status: AC
  Administered 2017-04-02: 4 mg via ORAL
  Filled 2017-04-02: qty 1

## 2017-04-02 NOTE — ED Notes (Signed)
Pt c/o recurrent panic attacks. Pt in NAD at this time.

## 2017-04-02 NOTE — ED Triage Notes (Signed)
PT arrives with complaints of chest pain that radiates to left arm and back. She reports shes been vomiting x 2 week and bed ridden. Pt also endorses dizziness. She states she is under a lot of stress and cant function

## 2017-04-02 NOTE — ED Notes (Signed)
Patient Alert and oriented X4. Stable and ambulatory. Patient verbalized understanding of the discharge instructions.  Patient belongings were taken by the patient.  

## 2017-04-02 NOTE — ED Provider Notes (Signed)
[facility] DEPT Provider Note   CSN: [id number] Arrival date & time: 04/02/17  1146     History   Chief Complaint Chief Complaint  Patient presents with  . Chest Pain    HPI Gina Lawson is a 26 y.o. female.  Patient presents with complaint of recurrent disease redness without full syncope, chest pain, shortness of breath. Patient states that she has had these symptoms ongoing for several weeks. She has a therapist and was started on Prozac was felt that this made her feel worse. She has been on anxiety medication in the past. She reports having frequent vomiting and diarrhea. Chest pain is generalized, nonradiating. She was seen at an urgent care 5 days ago and started on propranolol, omeprazole, and Zofran which have been helping somewhat. Symptoms were worse this morning prompting emergency department visit. She admits to being under a lot of stress. She does not member having her thyroid checked recently. Patient denies risk factors for pulmonary embolism including: unilateral leg swelling, history of DVT/PE/other blood clots, use of exogenous hormones, recent immobilizations, recent surgery, recent travel (>4hr segment), malignancy, hemoptysis. The onset of this condition was acute. The course is constant. Aggravating factors: none. Alleviating factors: none.         Past Medical History:  Diagnosis Date  . Anxiety   . Cancer ([place])   . Fibromyalgia   . Herniated disc   . Hypertension   . Vitamin D deficiency     There are no active problems to display for this patient.   Past Surgical History:  Procedure Laterality Date  . APPENDECTOMY    . CHOLECYSTECTOMY    . HYSTEROSCOPY N/A 10/27/2016   Procedure: HYSTEROSCOPY, DILATATION AND CURETTAGE;  Surgeon: Sanjuana Kava, MD;  Location: [facility];  Service: Gynecology;  Laterality: N/A;  . LAPAROSCOPY N/A 10/27/2016   Procedure: DIAGNOSTIC LAPAROSCOPY;  Surgeon: Sanjuana Kava, MD;  Location: [facility];  Service: Gynecology;  Laterality: N/A;    OB History    No data available       Home Medications    Prior to Admission medications   Medication Sig Start Date End Date Taking? Authorizing Provider  ibuprofen (ADVIL,MOTRIN) 800 MG tablet Take 1 tablet (800 mg total) by mouth every 8 (eight) hours as needed for cramping. 10/27/16   Sanjuana Kava, MD  omeprazole (PRILOSEC) 40 MG capsule Take 1 capsule (40 mg total) by mouth 2 (two) times daily before a meal. 03/28/17 04/12/17  [name], MD  ondansetron (ZOFRAN ODT) 4 MG disintegrating tablet Take 1 tablet (4 mg total) by mouth every 8 (eight) hours as needed for nausea or vomiting. 10/31/16   Espina, Kemper Durie, PA  ondansetron (ZOFRAN) 4 MG tablet Take 2 tablets (8 mg total) by mouth every 4 (four) hours as needed for nausea or vomiting. 03/28/17   [name], MD  oxyCODONE-acetaminophen (PERCOCET/ROXICET) 5-325 MG tablet Take 1-2 tablets by mouth every 6 (six) hours as needed for severe pain. 10/31/16   [name], PA  propranolol (INDERAL) 10 MG tablet Take 1 tablet (10 mg total) by mouth 4 (four) times daily as needed. Can take up to 2 tablets 4 times daily as needed. 03/28/17 04/07/17  [name], MD    Family History No family history on file.  Social History Social History  Substance Use Topics  . Smoking status: Former Smoker    Packs/day: 0.50    Years: 12.00    Types:  Cigarettes  . Smokeless tobacco: Never Used  . Alcohol use Yes     Comment: occ     Allergies   Penicillins; Tramadol; Adhesive [tape]; Amoxicillin; Dilaudid [hydromorphone hcl]; and Morphine and related   Review of Systems Review of Systems  Constitutional: Negative for diaphoresis and fever.  Eyes: Negative for redness.  Respiratory: Negative for cough and shortness of breath.   Cardiovascular: Positive for chest pain. Negative for palpitations and leg swelling.  Gastrointestinal: Positive for  diarrhea, nausea and vomiting. Negative for abdominal pain.  Genitourinary: Negative for dysuria.  Musculoskeletal: Negative for back pain and neck pain.  Skin: Negative for rash.  Neurological: Positive for dizziness and light-headedness. Negative for syncope and headaches.  Psychiatric/Behavioral: The patient is nervous/anxious.      Physical Exam Updated Vital Signs BP (!) 133/96   Pulse 80   Temp 99.2 F (37.3 C) (Oral)   Resp 11   Ht 5\' 1"  (1.549 m)   Wt 40.8 kg (90 lb)   LMP 03/24/2017   SpO2 100%   BMI 17.01 kg/m   Physical Exam  Constitutional: She appears well-developed and well-nourished.  HENT:  Head: Normocephalic and atraumatic.  Mouth/Throat: Oropharynx is clear and moist and mucous membranes are normal. Mucous membranes are not dry.  Eyes: Conjunctivae are normal.  Neck: Trachea normal and normal range of motion. Neck supple. Normal carotid pulses and no JVD present. No muscular tenderness present. Carotid bruit is not present. No tracheal deviation present.  Cardiovascular: Normal rate, regular rhythm, S1 normal, S2 normal, normal heart sounds and intact distal pulses.  Exam reveals no decreased pulses.   No murmur heard. Pulmonary/Chest: Effort normal. No respiratory distress. She has no wheezes. She exhibits no tenderness.  Abdominal: Soft. Normal aorta and bowel sounds are normal. There is no tenderness. There is no rebound and no guarding.  Musculoskeletal: Normal range of motion.  Neurological: She is alert.  Skin: Skin is warm and dry. She is not diaphoretic. No cyanosis. No pallor.  Psychiatric: Her mood appears anxious.  Nursing note and vitals reviewed.    ED Treatments / Results  Labs (all labs ordered are listed, but only abnormal results are displayed) Labs Reviewed  BASIC METABOLIC PANEL - Abnormal; Notable for the following:       Result Value   Glucose, Bld 106 (*)    All other components within normal limits  TSH - Abnormal; Notable  for the following:    TSH 0.311 (*)    All other components within normal limits  CBC  D-DIMER, QUANTITATIVE (NOT AT [facility])  Randolm Idol, ED    EKG  EKG Interpretation  Date/Time:  Thursday Apr 02 2017 12:02:39 EDT Ventricular Rate:  82 PR Interval:  100 QRS Duration: 82 QT Interval:  354 QTC Calculation: 413 R Axis:   75 Text Interpretation:  Sinus rhythm with short PR Otherwise normal ECG No significant change since last tracing Confirmed by [facility] MD, [place] ([id number]) on 04/02/2017 1:33:14 PM       Radiology Dg Chest 2 View  Result Date: 04/02/2017 CLINICAL DATA:  Chest pain EXAM: CHEST  2 VIEW COMPARISON:  10/31/2016 FINDINGS: EKG leads create artifact over the chest. Normal heart size and mediastinal contours. No acute infiltrate or edema. No effusion or pneumothorax. No acute osseous findings. Cholecystectomy clips. IMPRESSION: Negative chest. Electronically Signed   By: Monte Fantasia M.D.   On: 04/02/2017 13:37    Procedures Procedures (including critical care time)  Medications Ordered  in ED Medications  ondansetron (ZOFRAN-ODT) disintegrating tablet 4 mg (4 mg Oral Given 04/02/17 1423)  LORazepam (ATIVAN) tablet 0.5 mg (0.5 mg Oral Given 04/02/17 1423)     Initial Impression / Assessment and Plan / ED Course  I have reviewed the triage vital signs and the nursing notes.  Pertinent labs & imaging results that were available during my care of the patient were reviewed by me and considered in my medical decision making (see chart for details).     Patient seen and examined. Work-up initiated - TSH added, will check d-dimer given CP, SOB. Medications ordered. Labs otherwise reassuring, CXR/EKG reviewed.   Vital signs reviewed and are as follows: BP (!) 133/96   Pulse 80   Temp 99.2 F (37.3 C) (Oral)   Resp 11   Ht 5\' 1"  (1.549 m)   Wt 40.8 kg (90 lb)   LMP 03/24/2017   SpO2 100%   BMI 17.01 kg/m    3:38 PM patient updated on negative d-dimer,  slightly low TSH. I encouraged her to have this rechecked by her primary care in the future. I doubt that this would explain her symptoms completely.  Offered hydroxyzine for anxiety, patient states that this has not worked for her.  Given chronic opioid use, would not prescribe additional benzodiazepines currently.  Encourage close PCP follow-up and follow-up with her therapist. Return in emergency department with worsening symptoms, new persistent chest pain, shortness of breath, syncope, new symptoms or other concerns.   Final Clinical Impressions(s) / ED Diagnoses   Final diagnoses:  Nonspecific chest pain  Shortness of breath  Anxiety   Patient with nonspecific chest pain or shortness of breath in setting of anxiety. Troponin, EKG, chest x-ray, d-dimer are negative. Patient was recently started on propranolol and GI medications. She is following up with her therapist soon. She was recently tried on Prozac which she could not tolerate. No SI, HI, or other indications for immediate psychiatric evaluation. Unfortunately do not have any new treatments for her, I encouraged her to continue recently prescribed medications.    New Prescriptions Current Discharge Medication List       Carlisle Cater, Hershal Coria 04/02/17 1541    Gareth Morgan, MD 04/03/17 1141

## 2017-04-02 NOTE — Discharge Instructions (Signed)
Please read and follow all provided instructions.  Your diagnoses today include:  1. Nonspecific chest pain   2. Shortness of breath   3. Anxiety     Tests performed today include:  An EKG of your heart  A chest x-ray  Cardiac enzymes - a blood test for heart muscle damage  Blood counts and electrolytes  Thyroid test - shows slightly high thyroid hormone  D-dimer screening test for blood clot - no blood clot  Vital signs. See below for your results today.   Medications prescribed:   None  Take any prescribed medications only as directed.  Follow-up instructions: Please follow-up with your primary care provider and therapist as soon as you can for further evaluation of your symptoms.   Return instructions:  SEEK IMMEDIATE MEDICAL ATTENTION IF:  You have severe chest pain, especially if the pain is crushing or pressure-like and spreads to the arms, back, neck, or jaw, or if you have sweating, nausea (feeling sick to your stomach), or shortness of breath. THIS IS AN EMERGENCY. Don't wait to see if the pain will go away. Get medical help at once. Call 911 or [phone] (operator). DO NOT drive yourself to the hospital.   Your chest pain gets worse and does not go away with rest.   You have an attack of chest pain lasting longer than usual, despite rest and treatment with the medications your caregiver has prescribed.   You wake from sleep with chest pain or shortness of breath.  You feel dizzy or faint.  You have chest pain not typical of your usual pain for which you originally saw your caregiver.   You have any other emergent concerns regarding your health.  Additional Information: Chest pain comes from many different causes. Your caregiver has diagnosed you as having chest pain that is not specific for one problem, but does not require admission.  You are at low risk for an acute heart condition or other serious illness.   Your vital signs today were: BP 122/83    Pulse 84     Temp 99.2 F (37.3 C) (Oral)    Resp 18    Ht 5\' 1"  (1.549 m)    Wt 40.8 kg (90 lb)    LMP 03/24/2017    SpO2 100%    BMI 17.01 kg/m  If your blood pressure (BP) was elevated above 135/85 this visit, please have this repeated by your doctor within one month. --------------

## 2017-04-30 IMAGING — CT CT ABD-PELV W/ CM
2 of 4 series · 15 of 46 positions shown, 17 images · IV contrast (Omni 300)
Comparison: 04/15/2016 pelvic ultrasound and renal ultrasound.

CLINICAL DATA: 25 y/o F; severe lower abdominal pain with nausea.
History of appendectomy and cholecystectomy.

EXAM:
CT ABDOMEN AND PELVIS WITH CONTRAST
TECHNIQUE: Multidetector CT imaging of the abdomen and pelvis was performed
using the standard protocol following bolus administration of
intravenous contrast.
CONTRAST:  90 cc [id number] IOPAMIDOL ([id number]-933) INJECTION 61%

[Series 2: a/p w/ 5mm · axial · 0.48mm/px · z∈[+678,+1073]mm · 12 of 87 slices shown, 14 images]
[im 4/87  soft-tissue]
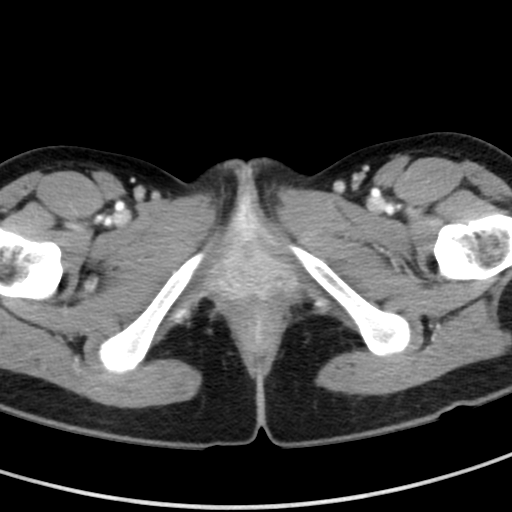
[im 4/87  bone]
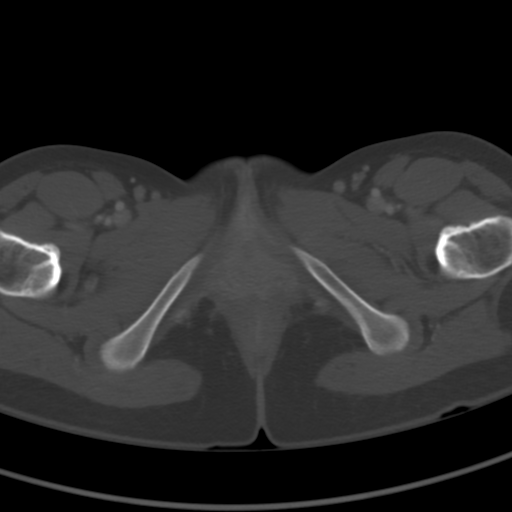
[im 12/87  soft-tissue]
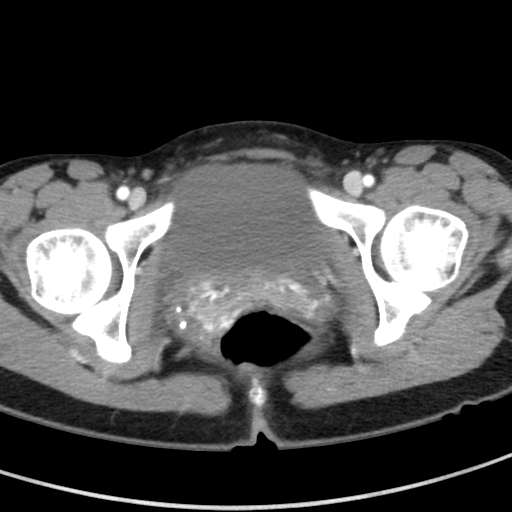
[im 19/87  soft-tissue]
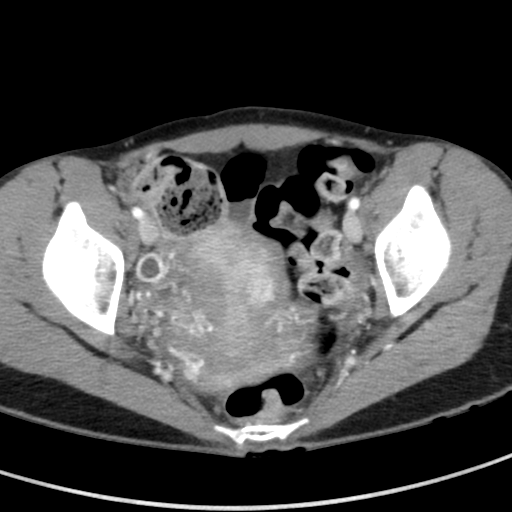
[im 27/87  soft-tissue]
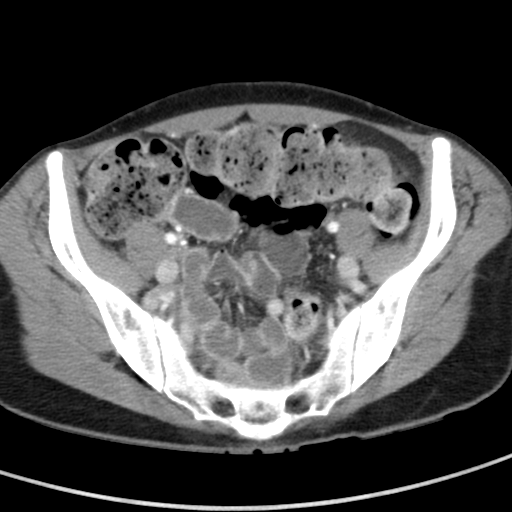
[im 34/87  soft-tissue]
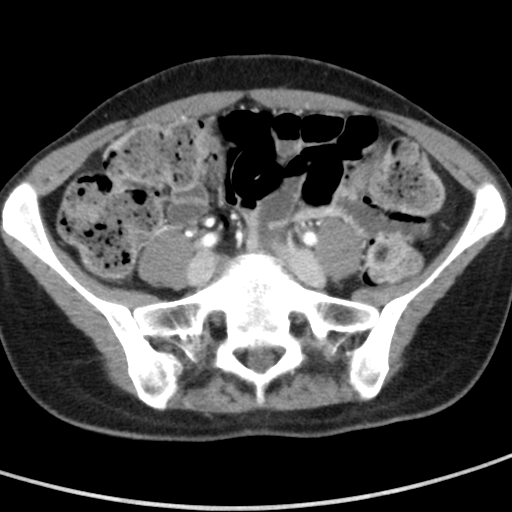
[im 42/87  soft-tissue]
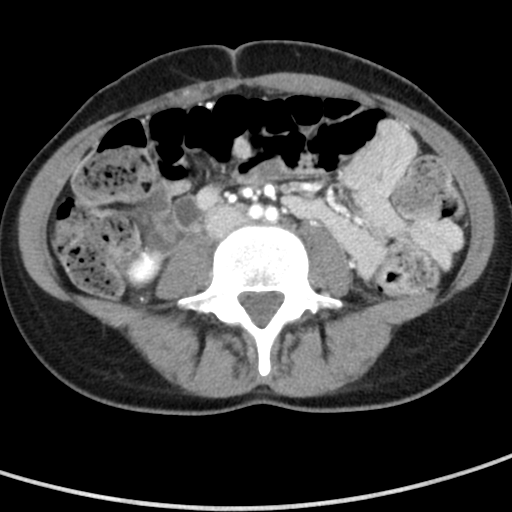
[im 45/87  soft-tissue]
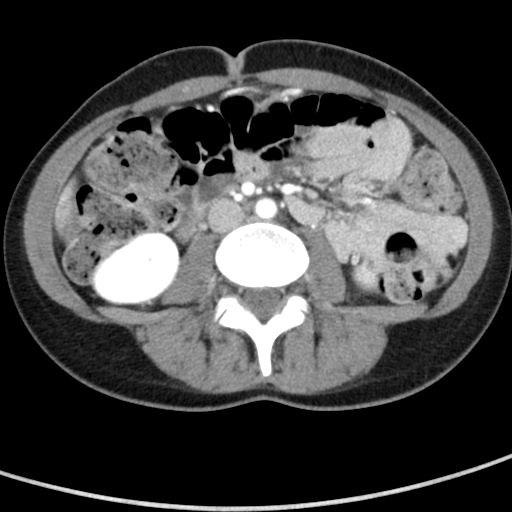
[im 53/87  soft-tissue]
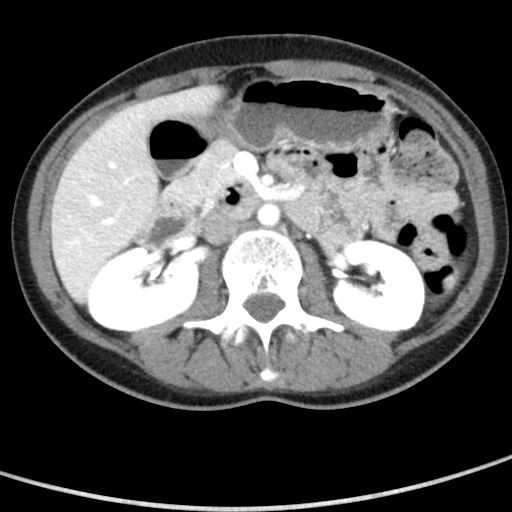
[im 60/87  soft-tissue]
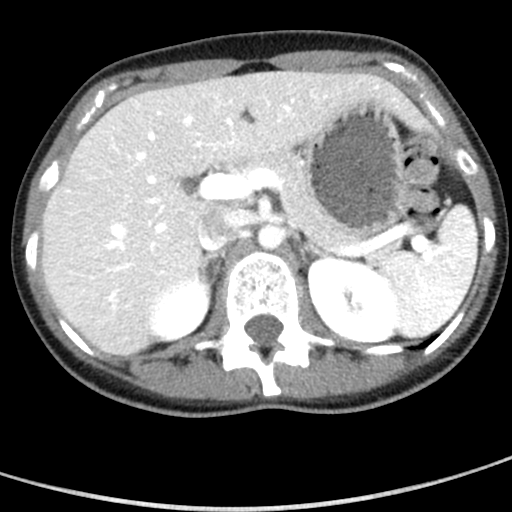
[im 60/87  bone]
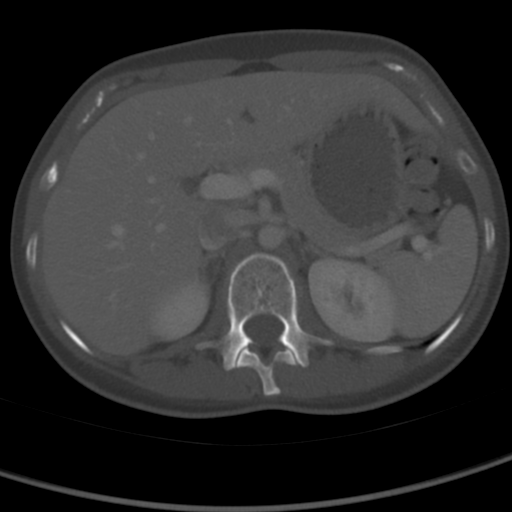
[im 68/87  soft-tissue]
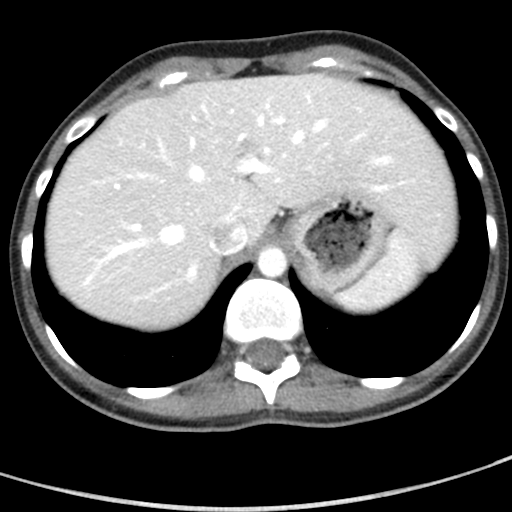
[im 75/87  soft-tissue]
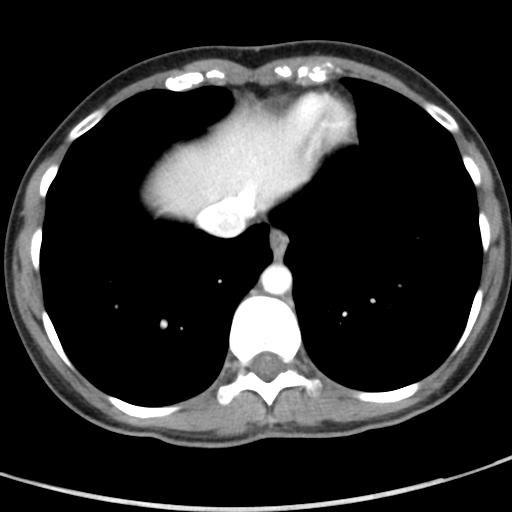
[im 83/87  soft-tissue]
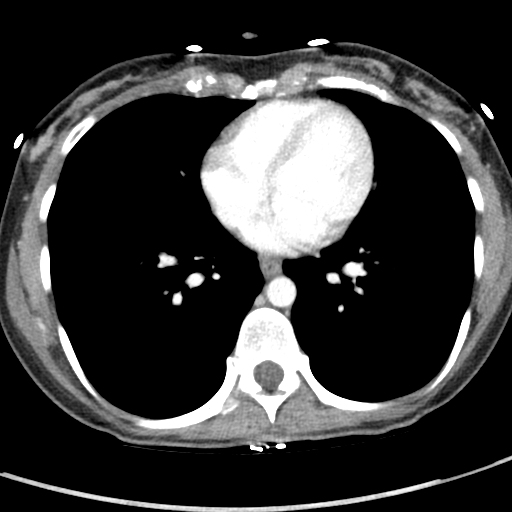

[Series 5: a/p w/ cor · coronal · 0.51mm/px · 3 of 95 slices shown]
[im 32/95  soft-tissue]
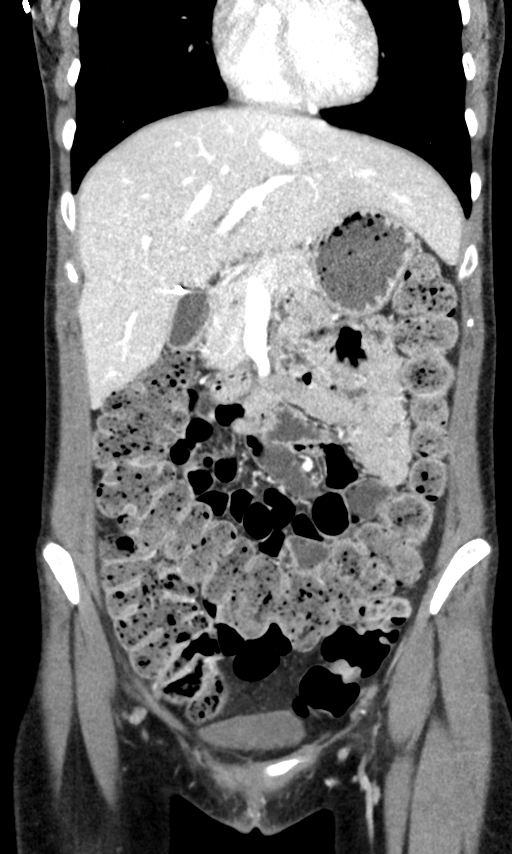
[im 42/95  soft-tissue]
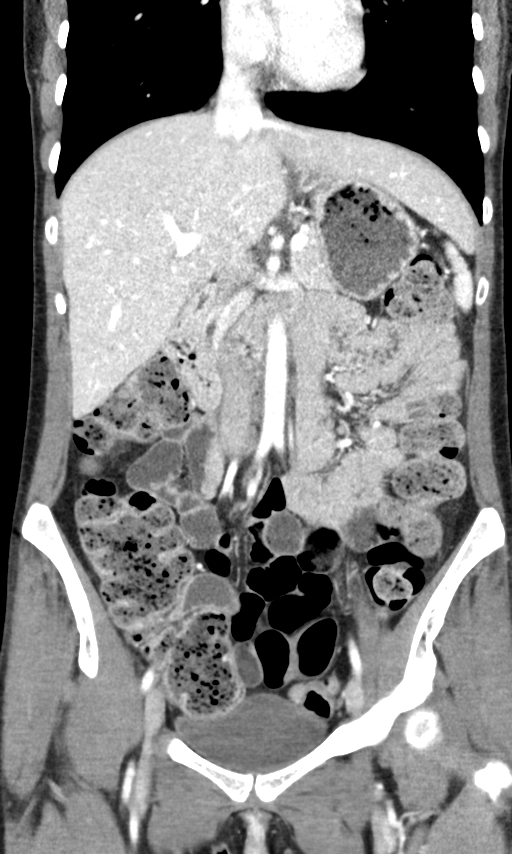
[im 53/95  soft-tissue]
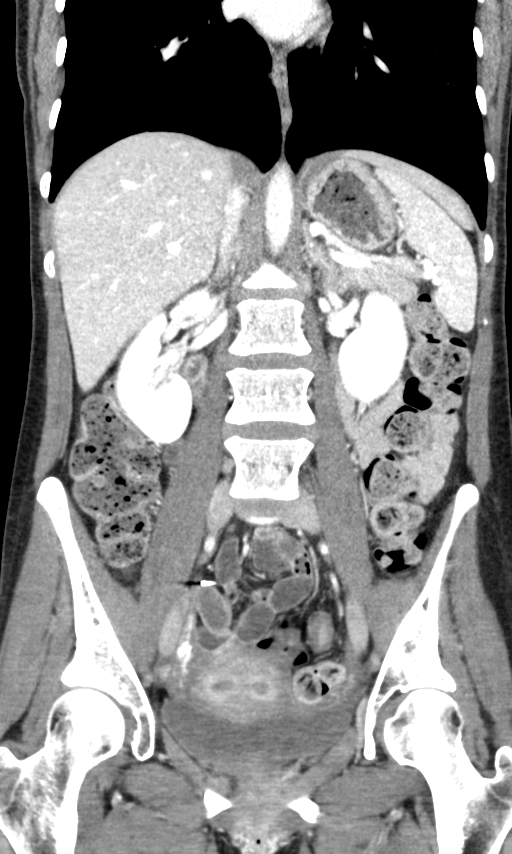

[15 of 46 positions shown; findings below may reference images not displayed]

FINDINGS: Lower chest: No acute abnormality.

Hepatobiliary: No focal liver abnormality is seen. Status post
cholecystectomy. No biliary dilatation.

Pancreas: Unremarkable. No pancreatic ductal dilatation or
surrounding inflammatory changes.

Spleen: Normal in size without focal abnormality.

Adrenals/Urinary Tract: Adrenal glands are unremarkable. Kidneys are
normal, without renal calculi, focal lesion, or hydronephrosis.
Bladder is unremarkable.

Stomach/Bowel: Stomach is within normal limits. Status post
appendectomy. No evidence of bowel wall thickening, distention, or
inflammatory changes.

Vascular/Lymphatic: No significant vascular findings are present. No
enlarged abdominal or pelvic lymph nodes.

Reproductive: Uterus and bilateral adnexa are unremarkable.

Other: No abdominal wall hernia or abnormality. No abdominopelvic
ascites.

Musculoskeletal: No acute or significant osseous findings. L5-S1
disc bulge.
IMPRESSION: No acute process is identified as explanation for pain. Status post
cholecystectomy and appendectomy. Otherwise unremarkable CT.

By: Baylee Hayashi M.D.

## 2017-06-18 ENCOUNTER — Emergency Department (HOSPITAL_COMMUNITY): Payer: Medicaid Other

## 2017-06-18 ENCOUNTER — Encounter (HOSPITAL_COMMUNITY): Payer: Self-pay | Admitting: Emergency Medicine

## 2017-06-18 ENCOUNTER — Emergency Department (HOSPITAL_COMMUNITY)
Admission: EM | Admit: 2017-06-18 | Discharge: 2017-06-18 | Disposition: A | Discharge status: Home / Self Care | Payer: Medicaid Other | Attending: Emergency Medicine | Admitting: Emergency Medicine

## 2017-06-18 DIAGNOSIS — Y999 Unspecified external cause status: Secondary | ICD-10-CM | POA: Insufficient documentation

## 2017-06-18 DIAGNOSIS — Y929 Unspecified place or not applicable: Secondary | ICD-10-CM | POA: Insufficient documentation

## 2017-06-18 DIAGNOSIS — Z7983 Long term (current) use of bisphosphonates: Secondary | ICD-10-CM | POA: Diagnosis not present

## 2017-06-18 DIAGNOSIS — Z79899 Other long term (current) drug therapy: Secondary | ICD-10-CM | POA: Insufficient documentation

## 2017-06-18 DIAGNOSIS — Y9389 Activity, other specified: Secondary | ICD-10-CM | POA: Diagnosis not present

## 2017-06-18 DIAGNOSIS — S134XXA Sprain of ligaments of cervical spine, initial encounter: Secondary | ICD-10-CM | POA: Diagnosis not present

## 2017-06-18 DIAGNOSIS — I10 Essential (primary) hypertension: Secondary | ICD-10-CM | POA: Insufficient documentation

## 2017-06-18 DIAGNOSIS — Z87891 Personal history of nicotine dependence: Secondary | ICD-10-CM | POA: Insufficient documentation

## 2017-06-18 DIAGNOSIS — S199XXA Unspecified injury of neck, initial encounter: Secondary | ICD-10-CM | POA: Diagnosis present

## 2017-06-18 DIAGNOSIS — R1084 Generalized abdominal pain: Secondary | ICD-10-CM | POA: Diagnosis not present

## 2017-06-18 LAB — COMPREHENSIVE METABOLIC PANEL
ALBUMIN: 4.4 g/dL (ref 3.5–5.0)
ALT: 11 U/L — AB (ref 14–54)
AST: 25 U/L (ref 15–41)
Alkaline Phosphatase: 48 U/L (ref 38–126)
Anion gap: 8 (ref 5–15)
BUN: 13 mg/dL (ref 6–20)
CO2: 24 mmol/L (ref 22–32)
Calcium: 9.1 mg/dL (ref 8.9–10.3)
Chloride: 108 mmol/L (ref 101–111)
Creatinine, Ser: 0.55 mg/dL (ref 0.44–1.00)
GFR calc Af Amer: >60 mL/min (ref >60)
GLUCOSE: 110 mg/dL — AB (ref 65–99)
POTASSIUM: 4 mmol/L (ref 3.5–5.1)
Sodium: 140 mmol/L (ref 135–145)
Total Bilirubin: 0.8 mg/dL (ref 0.3–1.2)
Total Protein: 6.8 g/dL (ref 6.5–8.1)

## 2017-06-18 LAB — URINALYSIS, ROUTINE W REFLEX MICROSCOPIC
Bilirubin Urine: NEGATIVE
Glucose, UA: NEGATIVE mg/dL
HGB URINE DIPSTICK: NEGATIVE
Ketones, ur: NEGATIVE mg/dL
LEUKOCYTES UA: NEGATIVE
Nitrite: NEGATIVE
PROTEIN: 30 mg/dL — AB
SPECIFIC GRAVITY, URINE: 1.032 — AB (ref 1.005–1.030)
pH: 5 (ref 5.0–8.0)

## 2017-06-18 LAB — POC URINE PREG, ED: PREG TEST UR: NEGATIVE

## 2017-06-18 LAB — CBC WITH DIFFERENTIAL/PLATELET
Basophils Absolute: 0 10*3/uL (ref 0.0–0.1)
Basophils Relative: 0 %
EOS ABS: 0.2 10*3/uL (ref 0.0–0.7)
EOS PCT: 2 %
HCT: 39.7 % (ref 36.0–46.0)
Hemoglobin: 13.5 g/dL (ref 12.0–15.0)
LYMPHS PCT: 48 %
Lymphs Abs: 5 10*3/uL — ABNORMAL HIGH (ref 0.7–4.0)
MCH: 30.3 pg (ref 26.0–34.0)
MCHC: 34 g/dL (ref 30.0–36.0)
MCV: 89 fL (ref 78.0–100.0)
MONOS PCT: 5 %
Monocytes Absolute: 0.5 10*3/uL (ref 0.1–1.0)
Neutro Abs: 4.6 10*3/uL (ref 1.7–7.7)
Neutrophils Relative %: 45 %
Platelets: 363 10*3/uL (ref 150–400)
RBC: 4.46 MIL/uL (ref 3.87–5.11)
RDW: 13.5 % (ref 11.5–15.5)
WBC: 10.3 10*3/uL (ref 4.0–10.5)

## 2017-06-18 MED ORDER — CYCLOBENZAPRINE HCL 10 MG PO TABS: 10.0000 mg | ORAL_TABLET | Freq: Three times a day (TID) | ORAL | 0 refills | Status: DC | PRN

## 2017-06-18 MED ORDER — FENTANYL CITRATE (PF) 100 MCG/2ML IJ SOLN
50.0000 ug | Freq: Once | INTRAMUSCULAR | Status: AC
  Administered 2017-06-18: 50 ug via INTRAVENOUS
  Filled 2017-06-18: qty 2

## 2017-06-18 MED ORDER — OXYCODONE-ACETAMINOPHEN 5-325 MG PO TABS
1.0000 | ORAL_TABLET | ORAL | Status: AC | PRN
  Administered 2017-06-18 (×2): 1 via ORAL
  Filled 2017-06-18: qty 1

## 2017-06-18 MED ORDER — OXYCODONE-ACETAMINOPHEN 5-325 MG PO TABS
ORAL_TABLET | ORAL | Status: AC
  Administered 2017-06-18: 1 via ORAL
  Filled 2017-06-18: qty 1

## 2017-06-18 MED ORDER — IOPAMIDOL (ISOVUE-300) INJECTION 61%
100.0000 mL | Freq: Once | INTRAVENOUS | Status: AC | PRN
  Administered 2017-06-18: 100 mL via INTRAVENOUS

## 2017-06-18 MED ORDER — ONDANSETRON HCL 4 MG/2ML IJ SOLN: 4.0000 mg | Freq: Once | INTRAMUSCULAR | Status: DC

## 2017-06-18 NOTE — ED Notes (Signed)
ED Provider at bedside. 
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
## 2017-06-18 NOTE — ED Notes (Signed)
Patient transported to X-ray 

## 2017-06-18 NOTE — Discharge Instructions (Signed)
Your work up was reassuring today. Please follow up with PCM as instructed. No drinking or driving while taking muscle relaxants.
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
## 2017-06-25 ENCOUNTER — Encounter (HOSPITAL_COMMUNITY): Payer: Self-pay | Admitting: Emergency Medicine

## 2017-06-25 ENCOUNTER — Emergency Department (HOSPITAL_COMMUNITY): Payer: Medicaid Other

## 2017-06-25 ENCOUNTER — Emergency Department (HOSPITAL_COMMUNITY)
Admission: EM | Admit: 2017-06-25 | Discharge: 2017-06-25 | Disposition: A | Discharge status: Home / Self Care | Payer: Medicaid Other | Attending: Emergency Medicine | Admitting: Emergency Medicine

## 2017-06-25 DIAGNOSIS — R1032 Left lower quadrant pain: Secondary | ICD-10-CM | POA: Diagnosis present

## 2017-06-25 DIAGNOSIS — I10 Essential (primary) hypertension: Secondary | ICD-10-CM | POA: Insufficient documentation

## 2017-06-25 DIAGNOSIS — Z79899 Other long term (current) drug therapy: Secondary | ICD-10-CM | POA: Diagnosis not present

## 2017-06-25 DIAGNOSIS — Z87891 Personal history of nicotine dependence: Secondary | ICD-10-CM | POA: Diagnosis not present

## 2017-06-25 DIAGNOSIS — N83202 Unspecified ovarian cyst, left side: Secondary | ICD-10-CM | POA: Insufficient documentation

## 2017-06-25 LAB — URINALYSIS, ROUTINE W REFLEX MICROSCOPIC
Bilirubin Urine: NEGATIVE
Glucose, UA: NEGATIVE mg/dL
Hgb urine dipstick: NEGATIVE
KETONES UR: NEGATIVE mg/dL
LEUKOCYTES UA: NEGATIVE
Nitrite: NEGATIVE
PH: 7 (ref 5.0–8.0)
Protein, ur: NEGATIVE mg/dL
SPECIFIC GRAVITY, URINE: 1.001 — AB (ref 1.005–1.030)

## 2017-06-25 LAB — CBC
HEMATOCRIT: 40.9 % (ref 36.0–46.0)
HEMOGLOBIN: 14 g/dL (ref 12.0–15.0)
MCH: 30.4 pg (ref 26.0–34.0)
MCHC: 34.2 g/dL (ref 30.0–36.0)
MCV: 88.7 fL (ref 78.0–100.0)
Platelets: 291 10*3/uL (ref 150–400)
RBC: 4.61 MIL/uL (ref 3.87–5.11)
RDW: 13.1 % (ref 11.5–15.5)
WBC: 9.4 10*3/uL (ref 4.0–10.5)

## 2017-06-25 LAB — COMPREHENSIVE METABOLIC PANEL
ALK PHOS: 50 U/L (ref 38–126)
ALT: 13 U/L — ABNORMAL LOW (ref 14–54)
AST: 19 U/L (ref 15–41)
Albumin: 3.9 g/dL (ref 3.5–5.0)
Anion gap: 7 (ref 5–15)
CO2: 28 mmol/L (ref 22–32)
Calcium: 9.6 mg/dL (ref 8.9–10.3)
Chloride: 99 mmol/L — ABNORMAL LOW (ref 101–111)
Creatinine, Ser: 0.52 mg/dL (ref 0.44–1.00)
GFR calc Af Amer: >60 mL/min (ref >60)
GFR calc non Af Amer: >60 mL/min (ref >60)
GLUCOSE: 96 mg/dL (ref 65–99)
POTASSIUM: 4.5 mmol/L (ref 3.5–5.1)
Sodium: 134 mmol/L — ABNORMAL LOW (ref 135–145)
Total Bilirubin: 0.4 mg/dL (ref 0.3–1.2)
Total Protein: 6.1 g/dL — ABNORMAL LOW (ref 6.5–8.1)

## 2017-06-25 LAB — LIPASE, BLOOD: Lipase: 24 U/L (ref 11–51)

## 2017-06-25 LAB — I-STAT BETA HCG BLOOD, ED (MC, WL, AP ONLY)

## 2017-06-25 MED ORDER — OXYCODONE-ACETAMINOPHEN 5-325 MG PO TABS
2.0000 | ORAL_TABLET | Freq: Once | ORAL | Status: AC
  Administered 2017-06-25: 2 via ORAL
  Filled 2017-06-25: qty 2

## 2017-06-25 NOTE — Discharge Instructions (Signed)
Your work up was reassuring today. Please follow up with PCM. Return to ED with any new or concerning symptoms.

## 2017-06-25 NOTE — ED Notes (Signed)
Patient transported to Ultrasound 

## 2017-06-25 NOTE — ED Notes (Signed)
EDP at bedside  
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
## 2017-06-25 NOTE — ED Notes (Signed)
Called ultrasound should be here in about 40 minutes.
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
## 2017-09-21 ENCOUNTER — Emergency Department (INDEPENDENT_AMBULATORY_CARE_PROVIDER_SITE_OTHER): Payer: Medicaid Other

## 2017-09-21 ENCOUNTER — Other Ambulatory Visit: Payer: Self-pay

## 2017-09-21 ENCOUNTER — Emergency Department
Admission: EM | Admit: 2017-09-21 | Discharge: 2017-09-21 | Disposition: A | Discharge status: Home / Self Care | Payer: Medicaid Other | Source: Home / Self Care | Attending: Family Medicine | Admitting: Family Medicine

## 2017-09-21 DIAGNOSIS — J069 Acute upper respiratory infection, unspecified: Secondary | ICD-10-CM | POA: Diagnosis not present

## 2017-09-21 DIAGNOSIS — J9801 Acute bronchospasm: Secondary | ICD-10-CM | POA: Diagnosis not present

## 2017-09-21 DIAGNOSIS — R05 Cough: Secondary | ICD-10-CM | POA: Diagnosis not present

## 2017-09-21 DIAGNOSIS — R634 Abnormal weight loss: Secondary | ICD-10-CM

## 2017-09-21 DIAGNOSIS — F17200 Nicotine dependence, unspecified, uncomplicated: Secondary | ICD-10-CM

## 2017-09-21 LAB — POCT CBC W AUTO DIFF (K'VILLE URGENT CARE)

## 2017-09-21 MED ORDER — METHYLPREDNISOLONE SODIUM SUCC 40 MG IJ SOLR
80.0000 mg | Freq: Once | INTRAMUSCULAR | Status: AC
  Administered 2017-09-21: 80 mg via INTRAMUSCULAR

## 2017-09-21 MED ORDER — IPRATROPIUM-ALBUTEROL 0.5-2.5 (3) MG/3ML IN SOLN
3.0000 mL | Freq: Once | RESPIRATORY_TRACT | Status: AC
  Administered 2017-09-21: 3 mL via RESPIRATORY_TRACT

## 2017-09-21 MED ORDER — PROMETHAZINE-DM 6.25-15 MG/5ML PO SYRP: 5.0000 mL | ORAL_SOLUTION | Freq: Three times a day (TID) | ORAL | 0 refills | Status: DC | PRN

## 2017-09-21 MED ORDER — IBUPROFEN 400 MG PO TABS
400.0000 mg | ORAL_TABLET | Freq: Once | ORAL | Status: AC
  Administered 2017-09-21: 400 mg via ORAL

## 2017-09-21 MED ORDER — PREDNISONE 20 MG PO TABS: ORAL_TABLET | ORAL | 0 refills | Status: DC

## 2017-09-21 MED ORDER — AZITHROMYCIN 250 MG PO TABS: 250.0000 mg | ORAL_TABLET | Freq: Every day | ORAL | 0 refills | Status: DC

## 2017-09-21 MED ORDER — ALBUTEROL SULFATE HFA 108 (90 BASE) MCG/ACT IN AERS: 1.0000 | INHALATION_SPRAY | Freq: Four times a day (QID) | RESPIRATORY_TRACT | 0 refills | Status: DC | PRN

## 2017-09-21 MED ORDER — BENZONATATE 100 MG PO CAPS: 100.0000 mg | ORAL_CAPSULE | Freq: Three times a day (TID) | ORAL | 0 refills | Status: DC

## 2017-09-21 MED ORDER — ALBUTEROL SULFATE (2.5 MG/3ML) 0.083% IN NEBU
5.0000 mg | INHALATION_SOLUTION | Freq: Once | RESPIRATORY_TRACT | Status: AC
  Administered 2017-09-21: 5 mg via RESPIRATORY_TRACT
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
## 2017-09-21 NOTE — Discharge Instructions (Signed)
°  Promethazine cough medication can cause fatigue. Do not drink alcohol, drive or take other sedating/drowsy causing medications while taking this cough medication.  You may take 500mg  acetaminophen every 4-6 hours or in combination with ibuprofen 400-600mg  every 6-8 hours as needed for pain, inflammation, and fever.  Be sure to drink at least eight 8oz glasses of water to stay well hydrated and get at least 8 hours of sleep at night, preferably more while sick.

## 2017-09-21 NOTE — ED Triage Notes (Signed)
Started Saturday 1 week ago with cough, Tuesday had fever and continued cough.  Non productive.  Today ears and throat started hurting.

## 2017-09-22 ENCOUNTER — Telehealth: Payer: Self-pay

## 2017-09-22 LAB — COMPLETE METABOLIC PANEL WITH GFR
AG Ratio: 1.7 (calc) (ref 1.0–2.5)
ALT: 10 U/L (ref 6–29)
AST: 16 U/L (ref 10–30)
Albumin: 4.5 g/dL (ref 3.6–5.1)
Alkaline phosphatase (APISO): 66 U/L (ref 33–115)
BUN: 15 mg/dL (ref 7–25)
CO2: 19 mmol/L — ABNORMAL LOW (ref 20–32)
Calcium: 9.5 mg/dL (ref 8.6–10.2)
Chloride: 106 mmol/L (ref 98–110)
Creat: 0.5 mg/dL (ref 0.50–1.10)
GFR, Est African American: 153 mL/min/{1.73_m2} (ref >=60)
GFR, Est Non African American: 132 mL/min/{1.73_m2} (ref >=60)
Globulin: 2.7 g/dL (calc) (ref 1.9–3.7)
Glucose, Bld: 97 mg/dL (ref 65–99)
Potassium: 4.2 mmol/L (ref 3.5–5.3)
Sodium: 137 mmol/L (ref 135–146)
Total Bilirubin: 0.2 mg/dL (ref 0.2–1.2)
Total Protein: 7.2 g/dL (ref 6.1–8.1)

## 2017-09-22 NOTE — Telephone Encounter (Signed)
Spoke with patient, gave lab results, felling about the same.  Will follow up as needed.

## 2017-09-26 ENCOUNTER — Emergency Department (HOSPITAL_BASED_OUTPATIENT_CLINIC_OR_DEPARTMENT_OTHER)
Admission: EM | Admit: 2017-09-26 | Discharge: 2017-09-26 | Disposition: A | Discharge status: Home / Self Care | Payer: Medicaid Other | Attending: Emergency Medicine | Admitting: Emergency Medicine

## 2017-09-26 ENCOUNTER — Other Ambulatory Visit: Payer: Self-pay

## 2017-09-26 ENCOUNTER — Telehealth: Payer: Self-pay | Admitting: Emergency Medicine

## 2017-09-26 ENCOUNTER — Encounter (HOSPITAL_BASED_OUTPATIENT_CLINIC_OR_DEPARTMENT_OTHER): Payer: Self-pay | Admitting: Emergency Medicine

## 2017-09-26 ENCOUNTER — Emergency Department (HOSPITAL_BASED_OUTPATIENT_CLINIC_OR_DEPARTMENT_OTHER): Payer: Medicaid Other

## 2017-09-26 DIAGNOSIS — R062 Wheezing: Secondary | ICD-10-CM | POA: Diagnosis not present

## 2017-09-26 DIAGNOSIS — Z79899 Other long term (current) drug therapy: Secondary | ICD-10-CM | POA: Diagnosis not present

## 2017-09-26 DIAGNOSIS — R05 Cough: Secondary | ICD-10-CM | POA: Insufficient documentation

## 2017-09-26 DIAGNOSIS — F419 Anxiety disorder, unspecified: Secondary | ICD-10-CM | POA: Diagnosis not present

## 2017-09-26 DIAGNOSIS — R059 Cough, unspecified: Secondary | ICD-10-CM

## 2017-09-26 DIAGNOSIS — R5383 Other fatigue: Secondary | ICD-10-CM | POA: Insufficient documentation

## 2017-09-26 DIAGNOSIS — R0609 Other forms of dyspnea: Secondary | ICD-10-CM | POA: Diagnosis not present

## 2017-09-26 DIAGNOSIS — R002 Palpitations: Secondary | ICD-10-CM | POA: Diagnosis not present

## 2017-09-26 DIAGNOSIS — I10 Essential (primary) hypertension: Secondary | ICD-10-CM | POA: Diagnosis not present

## 2017-09-26 DIAGNOSIS — F17210 Nicotine dependence, cigarettes, uncomplicated: Secondary | ICD-10-CM | POA: Diagnosis not present

## 2017-09-26 DIAGNOSIS — R071 Chest pain on breathing: Secondary | ICD-10-CM | POA: Insufficient documentation

## 2017-09-26 LAB — COMPREHENSIVE METABOLIC PANEL
ALBUMIN: 4.3 g/dL (ref 3.5–5.0)
ALK PHOS: 80 U/L (ref 38–126)
ALT: 32 U/L (ref 14–54)
AST: 24 U/L (ref 15–41)
Anion gap: 10 (ref 5–15)
BILIRUBIN TOTAL: 0.4 mg/dL (ref 0.3–1.2)
BUN: 12 mg/dL (ref 6–20)
CO2: 26 mmol/L (ref 22–32)
Calcium: 9.6 mg/dL (ref 8.9–10.3)
Chloride: 99 mmol/L — ABNORMAL LOW (ref 101–111)
Creatinine, Ser: 0.46 mg/dL (ref 0.44–1.00)
GFR calc Af Amer: >60 mL/min (ref >60)
GFR calc non Af Amer: >60 mL/min (ref >60)
GLUCOSE: 128 mg/dL — AB (ref 65–99)
POTASSIUM: 4.5 mmol/L (ref 3.5–5.1)
Sodium: 135 mmol/L (ref 135–145)
TOTAL PROTEIN: 7.8 g/dL (ref 6.5–8.1)

## 2017-09-26 LAB — CBC WITH DIFFERENTIAL/PLATELET
BASOS ABS: 0 10*3/uL (ref 0.0–0.1)
Basophils Relative: 0 %
Eosinophils Absolute: 0 10*3/uL (ref 0.0–0.7)
Eosinophils Relative: 0 %
HEMATOCRIT: 45 % (ref 36.0–46.0)
HEMOGLOBIN: 15.6 g/dL — AB (ref 12.0–15.0)
LYMPHS PCT: 6 %
Lymphs Abs: 1.1 10*3/uL (ref 0.7–4.0)
MCH: 30.6 pg (ref 26.0–34.0)
MCHC: 34.7 g/dL (ref 30.0–36.0)
MCV: 88.2 fL (ref 78.0–100.0)
MONOS PCT: 3 %
Monocytes Absolute: 0.6 10*3/uL (ref 0.1–1.0)
NEUTROS PCT: 91 %
Neutro Abs: 16.9 10*3/uL — ABNORMAL HIGH (ref 1.7–7.7)
Platelets: 484 10*3/uL — ABNORMAL HIGH (ref 150–400)
RBC: 5.1 MIL/uL (ref 3.87–5.11)
RDW: 12.9 % (ref 11.5–15.5)
WBC: 18.6 10*3/uL — AB (ref 4.0–10.5)

## 2017-09-26 LAB — TROPONIN I: Troponin I: <0.03 ng/mL (ref <0.03)

## 2017-09-26 LAB — D-DIMER, QUANTITATIVE: D-Dimer, Quant: 0.32 ug/mL-FEU (ref 0.00–0.50)

## 2017-09-26 MED ORDER — IPRATROPIUM BROMIDE 0.02 % IN SOLN
0.5000 mg | Freq: Once | RESPIRATORY_TRACT | Status: AC
  Administered 2017-09-26: 0.5 mg via RESPIRATORY_TRACT
  Filled 2017-09-26: qty 2.5

## 2017-09-26 MED ORDER — IPRATROPIUM-ALBUTEROL 0.5-2.5 (3) MG/3ML IN SOLN
3.0000 mL | Freq: Once | RESPIRATORY_TRACT | Status: AC
  Administered 2017-09-26: 3 mL via RESPIRATORY_TRACT
  Filled 2017-09-26: qty 3

## 2017-09-26 MED ORDER — ALBUTEROL SULFATE (2.5 MG/3ML) 0.083% IN NEBU
2.5000 mg | INHALATION_SOLUTION | Freq: Once | RESPIRATORY_TRACT | Status: AC
  Administered 2017-09-26: 2.5 mg via RESPIRATORY_TRACT
  Filled 2017-09-26: qty 3

## 2017-09-26 MED ORDER — SODIUM CHLORIDE 0.9 % IV BOLUS (SEPSIS): 1000.0000 mL | Freq: Once | INTRAVENOUS | Status: DC

## 2017-09-26 MED ORDER — ALBUTEROL (5 MG/ML) CONTINUOUS INHALATION SOLN
10.0000 mg/h | INHALATION_SOLUTION | RESPIRATORY_TRACT | Status: AC
  Administered 2017-09-26: 10 mg/h via RESPIRATORY_TRACT
  Filled 2017-09-26: qty 20

## 2017-09-26 MED ORDER — ALBUTEROL SULFATE (2.5 MG/3ML) 0.083% IN NEBU: 5.0000 mg | INHALATION_SOLUTION | Freq: Once | RESPIRATORY_TRACT | Status: DC

## 2017-09-26 MED ORDER — LORAZEPAM 1 MG PO TABS
0.5000 mg | ORAL_TABLET | Freq: Once | ORAL | Status: AC
  Administered 2017-09-26: 0.5 mg via ORAL
  Filled 2017-09-26: qty 1

## 2017-09-26 MED ORDER — IPRATROPIUM BROMIDE 0.02 % IN SOLN: 0.5000 mg | Freq: Once | RESPIRATORY_TRACT | Status: DC

## 2017-09-26 MED ORDER — OXYCODONE-ACETAMINOPHEN 5-325 MG PO TABS
1.0000 | ORAL_TABLET | Freq: Once | ORAL | Status: AC
  Administered 2017-09-26: 1 via ORAL
  Filled 2017-09-26: qty 1

## 2017-09-26 NOTE — ED Triage Notes (Signed)
Pt was seen at UC 5 days ago and treated for possible PNA; c/o SHOB and palpitations, worse since yesterday.

## 2017-09-26 NOTE — ED Notes (Signed)
Pt given d/c instructions. Verbalizes understanding. No questions. Instructed not to drive.

## 2017-09-26 NOTE — Discharge Instructions (Signed)
As discussed, continue to use your inhaler as needed and follow up with your primary care provider.  Get some rest and stay well hydrated. Discuss smoking cessation with your primary care provider.  Return sooner if experiencing worsening chest pain, shortness of breath, nausea vomiting, or any other new concerning symptoms in the meantime.

## 2017-09-26 NOTE — ED Notes (Signed)
Phyllis with lab states she will add on Dimer and Trop.

## 2017-09-26 NOTE — ED Notes (Signed)
Called to patient's room regarding her inhaler. Provided patient with an aerochamber and explained how to use it in conjunction with her albuterol MDI. Patient understood and tolerated well.

## 2017-09-27 NOTE — ED Provider Notes (Signed)
[facility] DEPART[facility] Provider Note   CSN: [id number] Arrival date & time: 09/26/17  1705     History   Chief Complaint Chief Complaint  Patient presents with  . Shortness of Breath  . Palpitations    HPI Gina Lawson is a 26 y.o. female with past medical history significant for anxiety, fibromyalgia, abnormal cells on Pap smear patient(patient denying any history of cancer), presenting with 2 weeks of cough, shortness of breath on exertion, palpitations, chest pain on breathing, and fatigue. She was seen at urgent care 5 days ago and prescribed a metered dose inhaler, prednisone and azithromycin. Patient denies any history of asthma or COPD she is on every day current smoker. She states that her coughing has improved over the last few days but she feels like she cant breath. No history of DVT/PE, estrogen use, malignancy, hemoptysis, prolonged immobilization or surgery, or unilateral leg swelling or pain.  HPI  Past Medical History:  Diagnosis Date  . Anxiety   . Cancer ([place])   . Fibromyalgia   . Herniated disc   . Hypertension   . Vitamin D deficiency     There are no active problems to display for this patient.   Past Surgical History:  Procedure Laterality Date  . APPENDECTOMY    . CHOLECYSTECTOMY    . DIAGNOSTIC LAPAROSCOPY N/A 10/27/2016   Performed by Sanjuana Kava, MD at [facility]  . HYSTEROSCOPY, DILATATION AND CURETTAGE N/A 10/27/2016   Performed by Sanjuana Kava, MD at [facility]    OB History    No data available       Home Medications    Prior to Admission medications   Medication Sig Start Date End Date Taking? Authorizing Provider  albuterol (PROVENTIL HFA;VENTOLIN HFA) 108 (90 Base) MCG/ACT inhaler Inhale 1-2 puffs every 6 (six) hours as needed into the lungs for wheezing or shortness of breath. 09/21/17   [name], PA-C  ALPRAZolam Duanne Moron) 1 MG tablet Take 0.5-1 tablets by mouth every 6  (six) hours as needed for anxiety.  06/04/17   [provider]  amphetamine-dextroamphetamine (ADDERALL) 20 MG tablet Take 20 tablets by mouth 2 (two) times daily as needed. 06/04/17   [provider]  azithromycin (ZITHROMAX) 250 MG tablet Take 1 tablet (250 mg total) daily by mouth. Take first 2 tablets together, then 1 every day until finished. 09/21/17   [name], PA-C  benzonatate (TESSALON) 100 MG capsule Take 1-2 capsules (100-200 mg total) every 8 (eight) hours by mouth. 09/21/17   [name], PA-C  cholecalciferol (VITAMIN D) 400 units TABS tablet Take 400 Units by mouth daily.    [provider]  cyclobenzaprine (FLEXERIL) 10 MG tablet Take 1 tablet (10 mg total) by mouth 3 (three) times daily as needed for muscle spasms. 06/18/17   Arnetha Massy, MD  IRON PO Take 1 tablet by mouth daily.    [provider]  Naproxen Sodium (ALEVE) 220 MG CAPS Take 440 mg by mouth daily as needed (for pain).  04/10/14   [provider]  omeprazole (PRILOSEC) 40 MG capsule Take 1 capsule (40 mg total) by mouth 2 (two) times daily before a meal. 03/28/17 06/18/17  [name], MD  ondansetron (ZOFRAN) 4 MG tablet Take 2 tablets (8 mg total) by mouth every 4 (four) hours as needed for nausea or vomiting. Patient not taking: Reported on 06/18/2017 03/28/17   [name], MD  oxyCODONE-acetaminophen (PERCOCET) 7.5-325 MG tablet Take 1-2 tablets by mouth 3 (three) times daily as needed for moderate pain or severe pain.  05/23/17   [provider]  oxyCODONE-acetaminophen (PERCOCET/ROXICET) 5-325 MG tablet Take 1-2 tablets by mouth every 6 (six) hours as needed for severe pain. Patient not taking: Reported on 06/18/2017 10/31/16   [name], [place]  predniSONE (DELTASONE) 20 MG tablet 3 tabs po day one, then 2 po daily x 4 days 09/21/17   [name], PA-C  promethazine-dextromethorphan (PROMETHAZINE-DM) 6.25-15 MG/5ML syrup Take 5 mLs 3  (three) times daily as needed by mouth for cough. Take with large glass of water 09/21/17   [name], PA-C  propranolol (INDERAL) 10 MG tablet Take 1 tablet (10 mg total) by mouth 4 (four) times daily as needed. Can take up to 2 tablets 4 times daily as needed. Patient not taking: Reported on 06/18/2017 03/28/17 04/07/17  [name], MD    Family History History reviewed. No pertinent family history.  Social History Social History   Tobacco Use  . Smoking status: Light Tobacco Smoker    Packs/day: 0.50    Years: 12.00    Pack years: 6.00    Types: Cigarettes  . Smokeless tobacco: Never Used  Substance Use Topics  . Alcohol use: Yes    Comment: occ  . Drug use: No     Allergies   Penicillins; Tramadol; Adhesive [tape]; Amoxicillin; Dilaudid [hydromorphone hcl]; and Morphine and related   Review of Systems Review of Systems  Constitutional: Positive for appetite change and fatigue. Negative for chills and fever.  HENT: Negative for congestion, ear pain and sore throat.   Eyes: Negative for pain, redness and visual disturbance.  Respiratory: Positive for cough, chest tightness and shortness of breath. Negative for choking and stridor.   Cardiovascular: Positive for chest pain and palpitations.  Gastrointestinal: Negative for abdominal distention, abdominal pain, nausea and vomiting.  Musculoskeletal: Negative for arthralgias, back pain, myalgias, neck pain and neck stiffness.  Skin: Negative for color change, pallor and rash.  Neurological: Negative for dizziness, seizures, syncope, light-headedness and headaches.     Physical Exam Updated Vital Signs BP 115/67 (BP Location: Right Arm)   Pulse (!) 118   Temp 98.3 F (36.8 C) (Oral)   Resp 20   LMP 08/27/2017   SpO2 97%   Physical Exam  Constitutional: She is oriented to person, place, and time. She appears well-developed and well-nourished.  Non-toxic appearance. She does not appear ill. No distress.    Afebrile, nontoxic-appearing, sitting comfortably in bed in no acute distress.  HENT:  Head: Normocephalic and atraumatic.  Eyes: Conjunctivae and EOM are normal.  Neck: Normal range of motion. Neck supple.  Cardiovascular: Normal rate, regular rhythm and normal heart sounds.  No murmur heard. Pulmonary/Chest: Effort normal. No accessory muscle usage or stridor. Tachypnea noted. No respiratory distress. She has wheezes in the right upper field, the right middle field, the right lower field, the left upper field, the left middle field and the left lower field. She has no rhonchi. She has no rales. She exhibits no mass, no tenderness and no retraction.  Abdominal: She exhibits no distension.  Musculoskeletal: Normal range of motion. She exhibits no edema.       Right lower leg: Normal. She exhibits no edema.       Left lower leg: Normal. She exhibits no edema.  Lymphadenopathy:    She has no cervical adenopathy.  Neurological: She  is alert and oriented to person, place, and time.  Skin: Skin is warm and dry. No ecchymosis and no rash noted. She is not diaphoretic. No erythema. No pallor.  Psychiatric: She has a normal mood and affect.  Nursing note and vitals reviewed.    ED Treatments / Results  Labs (all labs ordered are listed, but only abnormal results are displayed) Labs Reviewed  CBC WITH DIFFERENTIAL/PLATELET - Abnormal; Notable for the following components:      Result Value   WBC 18.6 (*)    Hemoglobin 15.6 (*)    Platelets 484 (*)    Neutro Abs 16.9 (*)    All other components within normal limits  COMPREHENSIVE METABOLIC PANEL - Abnormal; Notable for the following components:   Chloride 99 (*)    Glucose, Bld 128 (*)    All other components within normal limits  D-DIMER, QUANTITATIVE (NOT AT [facility])  TROPONIN I    EKG  EKG Interpretation  Date/Time:  Saturday September 26 2017 17:30:42 EST Ventricular Rate:  115 PR Interval:  84 QRS Duration: 70 QT  Interval:  290 QTC Calculation: 401 R Axis:   84 Text Interpretation:  Sinus tachycardia with short PR Otherwise normal ECG LVH no sig change since previous Confirmed by Charlesetta Shanks [phone]) on 09/26/2017 9:07:10 PM       Radiology Dg Chest 2 View  Result Date: 09/26/2017 CLINICAL DATA:  Cough and chest pain. History of lung nodule. Patient has cervical cancer. EXAM: CHEST  2 VIEW COMPARISON:  September 22, 2015 FINDINGS: The heart size and mediastinal contours are within normal limits. Both lungs are clear. The visualized skeletal structures are unremarkable. IMPRESSION: No active cardiopulmonary disease. Electronically Signed   By: Dorise Bullion III M.D   On: 09/26/2017 20:20    Procedures Procedures (including critical care time)  Medications Ordered in ED Medications  albuterol (PROVENTIL,VENTOLIN) solution continuous neb (0 mg/hr Nebulization Stopped 09/26/17 2258)  sodium chloride 0.9 % bolus 1,000 mL (1,000 mLs Intravenous Not Given 09/26/17 2314)  albuterol (PROVENTIL) (2.5 MG/3ML) 0.083% nebulizer solution 2.5 mg (2.5 mg Nebulization Given 09/26/17 2110)  ipratropium-albuterol (DUONEB) 0.5-2.5 (3) MG/3ML nebulizer solution 3 mL (3 mLs Nebulization Given 09/26/17 2110)  ipratropium (ATROVENT) nebulizer solution 0.5 mg (0.5 mg Nebulization Given 09/26/17 2148)  LORazepam (ATIVAN) tablet 0.5 mg (0.5 mg Oral Given 09/26/17 2220)  oxyCODONE-acetaminophen (PERCOCET/ROXICET) 5-325 MG per tablet 1 tablet (1 tablet Oral Given 09/26/17 2219)     Initial Impression / Assessment and Plan / ED Course  I have reviewed the triage vital signs and the nursing notes.  Pertinent labs & imaging results that were available during my care of the patient were reviewed by me and considered in my medical decision making (see chart for details).    Patient presenting with persistent shortness of breath on exertion and palpitations, feeling generally ill.  She was recently discharged 5 days  ago with metered-dose inhaler, steroids and azithromycin for wheezing and upper respiratory infection.  No history of asthma or COPD per patient is a current every day smoker. States that she has not been able to smoke since onset.  After being seen in urgent care she states that her cough has improved but she still feels like she can't breathe.  Lung exam with diffuse wheezing Chest x-ray negative for infiltrate or acute abnormalities. Negative dimer Patient is tachycardic which is likely related to albuterol use. EKG unremarkable, negative troponin  Patient was given nebulizing treatment and reported significant  improvement in her symptoms. It came to my attention that she was not properly instructed on how to use a metered-dose inhaler.  She was provided with a spacer and mask and thorough instructions.  Patient was well-appearing and ready go home. Will discharge home with metered dose inhaler and close follow-up with primary care provider.  Discussed strict return precautions and advised to return to the emergency department if experiencing any new or worsening symptoms. Instructions were understood and patient agreed with discharge plan.  Final Clinical Impressions(s) / ED Diagnoses   Final diagnoses:  Dyspnea on exertion  Cough  Anxiety  Palpitations  Chest pain on breathing    ED Discharge Orders    None       Dossie Der 09/27/17 8182    Charlesetta Shanks, MD 09/27/17 1627

## 2018-01-29 ENCOUNTER — Other Ambulatory Visit: Payer: Self-pay | Admitting: Specialist

## 2018-01-29 DIAGNOSIS — R569 Unspecified convulsions: Principal | ICD-10-CM

## 2018-01-29 DIAGNOSIS — G40109 Localization-related (focal) (partial) symptomatic epilepsy and epileptic syndromes with simple partial seizures, not intractable, without status epilepticus: Secondary | ICD-10-CM

## 2018-01-31 ENCOUNTER — Ambulatory Visit
Admission: RE | Admit: 2018-01-31 | Discharge: 2018-01-31 | Disposition: A | Discharge status: Home / Self Care | Payer: Medicaid Other | Source: Ambulatory Visit | Attending: Specialist | Admitting: Specialist

## 2018-01-31 DIAGNOSIS — G40109 Localization-related (focal) (partial) symptomatic epilepsy and epileptic syndromes with simple partial seizures, not intractable, without status epilepticus: Secondary | ICD-10-CM

## 2018-01-31 DIAGNOSIS — R569 Unspecified convulsions: Principal | ICD-10-CM

## 2018-03-31 IMAGING — DX DG CHEST 2V
2 series · 2 of 2 positions shown · non-contrast
Comparison: None.

CLINICAL DATA: Cough

EXAM:
CHEST  2 VIEW

[chest pa]
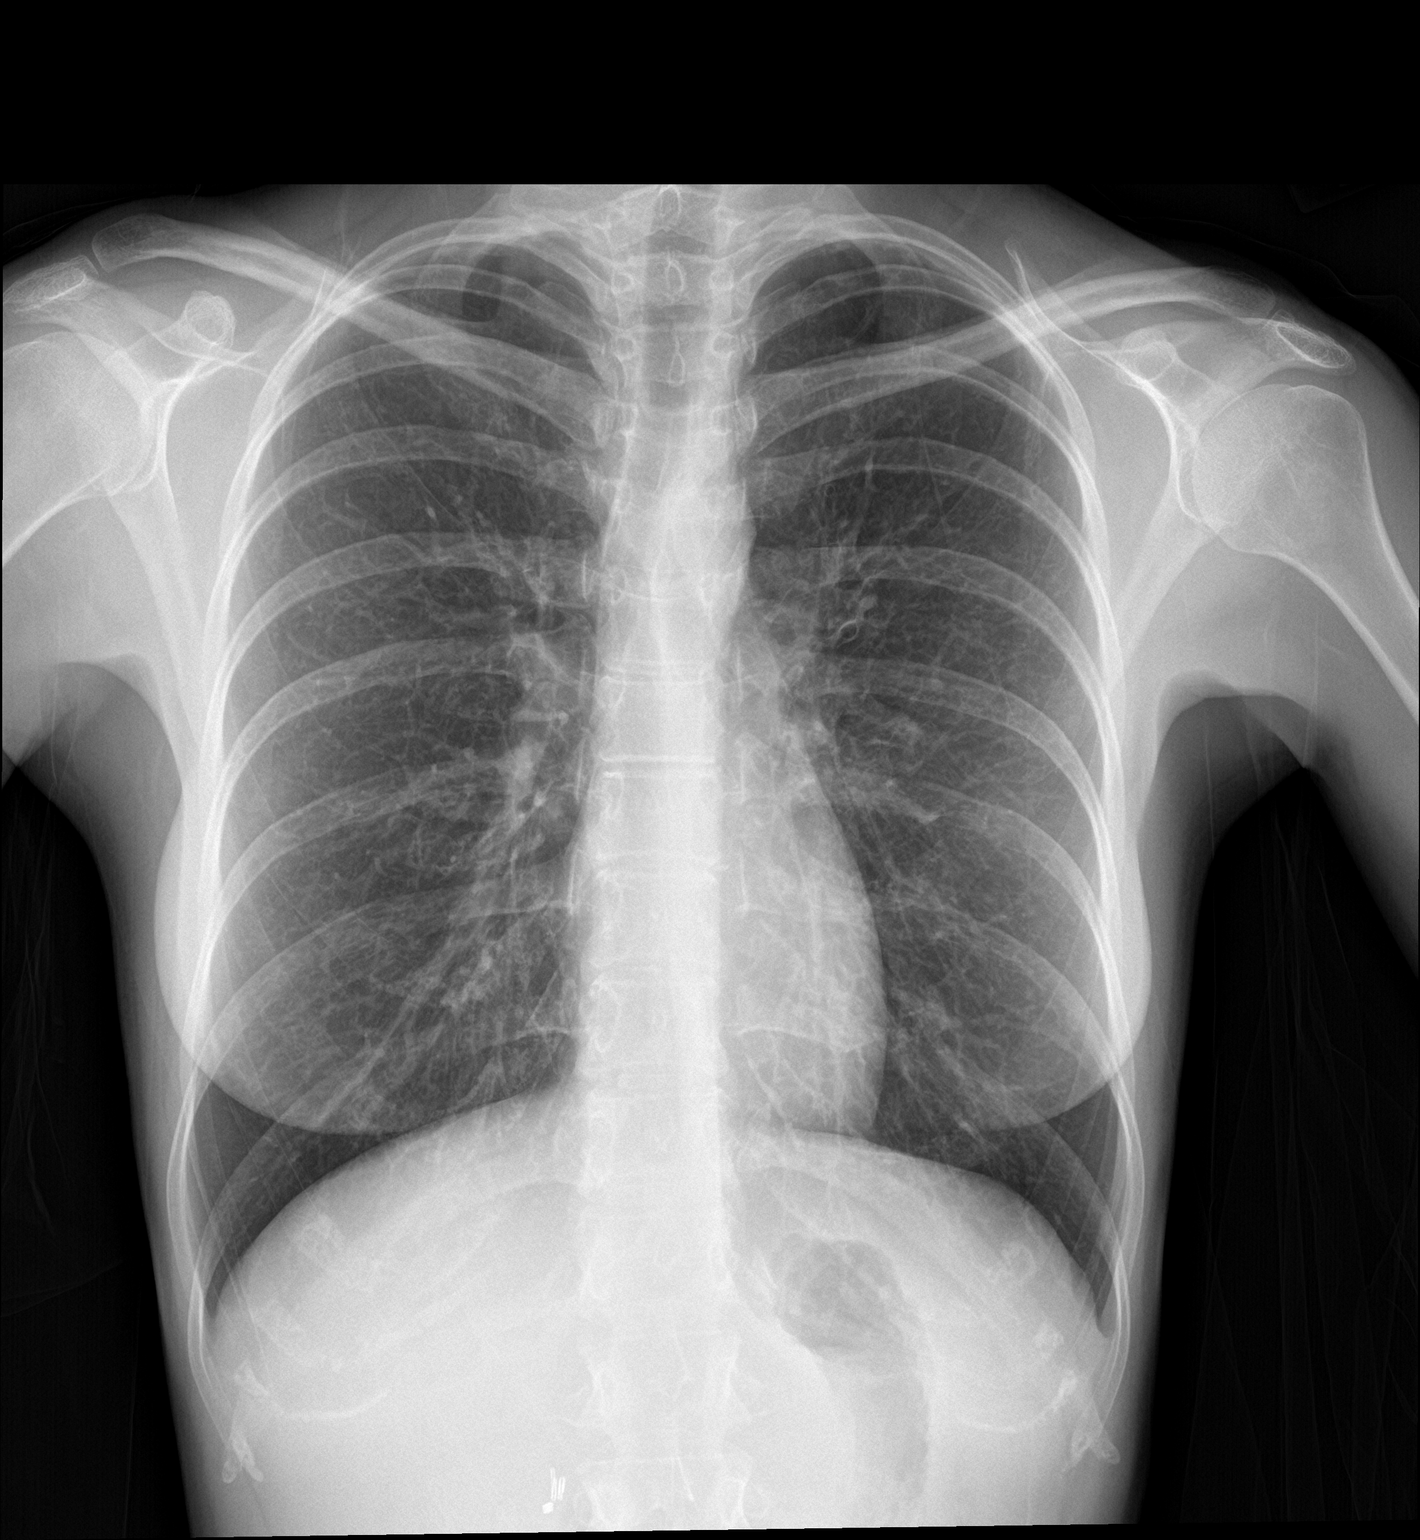

[chest lat]
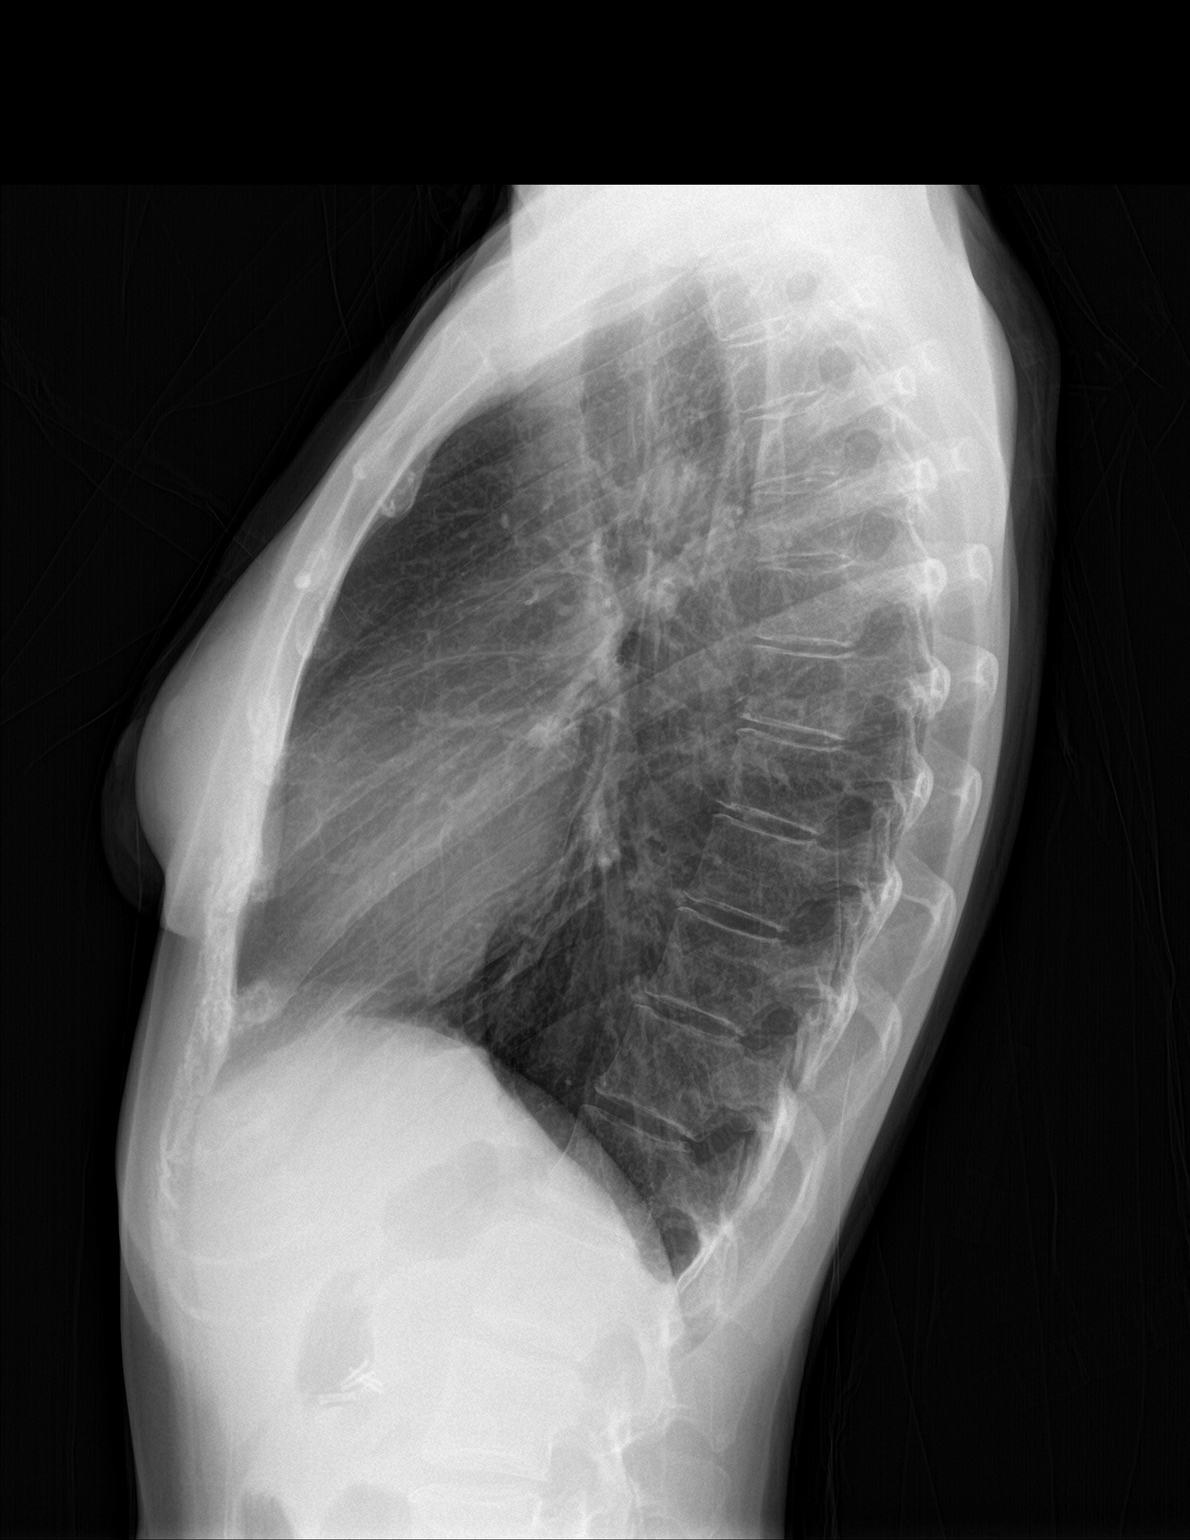

[2 of 2 positions shown; findings below may reference images not displayed]

FINDINGS: The heart size and mediastinal contours are within normal limits.
Both lungs are clear. The visualized skeletal structures are
unremarkable.
IMPRESSION: Normal chest radiograph.

## 2019-10-23 ENCOUNTER — Other Ambulatory Visit: Payer: Self-pay

## 2019-10-23 ENCOUNTER — Encounter (HOSPITAL_COMMUNITY): Payer: Self-pay | Admitting: Obstetrics and Gynecology

## 2019-10-23 ENCOUNTER — Inpatient Hospital Stay (HOSPITAL_COMMUNITY): Payer: Medicaid Other

## 2019-10-23 ENCOUNTER — Inpatient Hospital Stay (HOSPITAL_COMMUNITY)
Admission: AD | Admit: 2019-10-23 | Discharge: 2019-10-23 | Disposition: A | Discharge status: Home / Self Care | Payer: Medicaid Other | Attending: Obstetrics and Gynecology | Admitting: Obstetrics and Gynecology

## 2019-10-23 DIAGNOSIS — O161 Unspecified maternal hypertension, first trimester: Secondary | ICD-10-CM | POA: Diagnosis not present

## 2019-10-23 DIAGNOSIS — O208 Other hemorrhage in early pregnancy: Secondary | ICD-10-CM | POA: Insufficient documentation

## 2019-10-23 DIAGNOSIS — Z6710 Type A blood, Rh positive: Secondary | ICD-10-CM | POA: Insufficient documentation

## 2019-10-23 DIAGNOSIS — Z7952 Long term (current) use of systemic steroids: Secondary | ICD-10-CM | POA: Insufficient documentation

## 2019-10-23 DIAGNOSIS — O99341 Other mental disorders complicating pregnancy, first trimester: Secondary | ICD-10-CM | POA: Diagnosis not present

## 2019-10-23 DIAGNOSIS — F17210 Nicotine dependence, cigarettes, uncomplicated: Secondary | ICD-10-CM | POA: Diagnosis not present

## 2019-10-23 DIAGNOSIS — Z9049 Acquired absence of other specified parts of digestive tract: Secondary | ICD-10-CM | POA: Diagnosis not present

## 2019-10-23 DIAGNOSIS — Z79899 Other long term (current) drug therapy: Secondary | ICD-10-CM | POA: Insufficient documentation

## 2019-10-23 DIAGNOSIS — O99331 Smoking (tobacco) complicating pregnancy, first trimester: Secondary | ICD-10-CM | POA: Diagnosis not present

## 2019-10-23 DIAGNOSIS — Z859 Personal history of malignant neoplasm, unspecified: Secondary | ICD-10-CM | POA: Diagnosis not present

## 2019-10-23 DIAGNOSIS — O26891 Other specified pregnancy related conditions, first trimester: Secondary | ICD-10-CM | POA: Diagnosis not present

## 2019-10-23 DIAGNOSIS — F419 Anxiety disorder, unspecified: Secondary | ICD-10-CM | POA: Insufficient documentation

## 2019-10-23 DIAGNOSIS — O418X10 Other specified disorders of amniotic fluid and membranes, first trimester, not applicable or unspecified: Secondary | ICD-10-CM

## 2019-10-23 DIAGNOSIS — Z885 Allergy status to narcotic agent status: Secondary | ICD-10-CM | POA: Insufficient documentation

## 2019-10-23 DIAGNOSIS — O99891 Other specified diseases and conditions complicating pregnancy: Secondary | ICD-10-CM | POA: Diagnosis not present

## 2019-10-23 DIAGNOSIS — Z3490 Encounter for supervision of normal pregnancy, unspecified, unspecified trimester: Secondary | ICD-10-CM

## 2019-10-23 DIAGNOSIS — Z888 Allergy status to other drugs, medicaments and biological substances status: Secondary | ICD-10-CM | POA: Diagnosis not present

## 2019-10-23 DIAGNOSIS — Z3A01 Less than 8 weeks gestation of pregnancy: Secondary | ICD-10-CM | POA: Insufficient documentation

## 2019-10-23 DIAGNOSIS — O99351 Diseases of the nervous system complicating pregnancy, first trimester: Secondary | ICD-10-CM | POA: Insufficient documentation

## 2019-10-23 DIAGNOSIS — E559 Vitamin D deficiency, unspecified: Secondary | ICD-10-CM | POA: Diagnosis not present

## 2019-10-23 DIAGNOSIS — Z880 Allergy status to penicillin: Secondary | ICD-10-CM | POA: Diagnosis not present

## 2019-10-23 DIAGNOSIS — R109 Unspecified abdominal pain: Secondary | ICD-10-CM | POA: Diagnosis not present

## 2019-10-23 DIAGNOSIS — O99281 Endocrine, nutritional and metabolic diseases complicating pregnancy, first trimester: Secondary | ICD-10-CM | POA: Insufficient documentation

## 2019-10-23 DIAGNOSIS — O209 Hemorrhage in early pregnancy, unspecified: Secondary | ICD-10-CM

## 2019-10-23 DIAGNOSIS — O26899 Other specified pregnancy related conditions, unspecified trimester: Secondary | ICD-10-CM

## 2019-10-23 DIAGNOSIS — Z792 Long term (current) use of antibiotics: Secondary | ICD-10-CM | POA: Insufficient documentation

## 2019-10-23 DIAGNOSIS — M797 Fibromyalgia: Secondary | ICD-10-CM | POA: Diagnosis not present

## 2019-10-23 DIAGNOSIS — O468X1 Other antepartum hemorrhage, first trimester: Secondary | ICD-10-CM

## 2019-10-23 HISTORY — DX: Migraine, unspecified, not intractable, without status migrainosus: G43.909

## 2019-10-23 LAB — URINALYSIS, ROUTINE W REFLEX MICROSCOPIC
Bilirubin Urine: NEGATIVE
Glucose, UA: NEGATIVE mg/dL
Hgb urine dipstick: NEGATIVE
Ketones, ur: NEGATIVE mg/dL
Nitrite: NEGATIVE
Protein, ur: NEGATIVE mg/dL
Specific Gravity, Urine: 1.019 (ref 1.005–1.030)
pH: 5 (ref 5.0–8.0)

## 2019-10-23 LAB — CBC
HCT: 38.7 % (ref 36.0–46.0)
Hemoglobin: 13.3 g/dL (ref 12.0–15.0)
MCH: 30.4 pg (ref 26.0–34.0)
MCHC: 34.4 g/dL (ref 30.0–36.0)
MCV: 88.4 fL (ref 80.0–100.0)
Platelets: 363 10*3/uL (ref 150–400)
RBC: 4.38 MIL/uL (ref 3.87–5.11)
RDW: 13.1 % (ref 11.5–15.5)
WBC: 11 10*3/uL — ABNORMAL HIGH (ref 4.0–10.5)
nRBC: 0 % (ref 0.0–0.2)

## 2019-10-23 LAB — COMPREHENSIVE METABOLIC PANEL
ALT: 11 U/L (ref 0–44)
AST: 16 U/L (ref 15–41)
Albumin: 4 g/dL (ref 3.5–5.0)
Alkaline Phosphatase: 51 U/L (ref 38–126)
Anion gap: 8 (ref 5–15)
BUN: 12 mg/dL (ref 6–20)
CO2: 24 mmol/L (ref 22–32)
Calcium: 9.4 mg/dL (ref 8.9–10.3)
Chloride: 106 mmol/L (ref 98–111)
Creatinine, Ser: 0.64 mg/dL (ref 0.44–1.00)
GFR calc Af Amer: >60 mL/min (ref >60)
GFR calc non Af Amer: >60 mL/min (ref >60)
Glucose, Bld: 104 mg/dL — ABNORMAL HIGH (ref 70–99)
Potassium: 3.7 mmol/L (ref 3.5–5.1)
Sodium: 138 mmol/L (ref 135–145)
Total Bilirubin: 0.5 mg/dL (ref 0.3–1.2)
Total Protein: 6.2 g/dL — ABNORMAL LOW (ref 6.5–8.1)

## 2019-10-23 LAB — HCG, QUANTITATIVE, PREGNANCY: hCG, Beta Chain, Quant, S: 39798 m[IU]/mL — ABNORMAL HIGH (ref <5)

## 2019-10-23 LAB — POCT PREGNANCY, URINE: Preg Test, Ur: POSITIVE — AB

## 2019-10-23 MED ORDER — PREPLUS 27-1 MG PO TABS
1.0000 | ORAL_TABLET | Freq: Every day | ORAL | 13 refills | Status: DC
Start: 2019-10-23 — End: 2020-01-17

## 2019-10-23 MED ORDER — ACETAMINOPHEN 500 MG PO TABS
1000.0000 mg | ORAL_TABLET | Freq: Once | ORAL | Status: AC
  Administered 2019-10-23: 1000 mg via ORAL
  Filled 2019-10-23: qty 2

## 2019-10-23 MED ORDER — OXYCODONE HCL 5 MG PO TABS
5.0000 mg | ORAL_TABLET | Freq: Once | ORAL | Status: AC
  Administered 2019-10-23: 5 mg via ORAL
  Filled 2019-10-23: qty 1

## 2019-10-23 MED ORDER — DIPHENHYDRAMINE HCL 25 MG PO CAPS
25.0000 mg | ORAL_CAPSULE | Freq: Once | ORAL | Status: AC
  Administered 2019-10-23: 19:00:00 25 mg via ORAL
  Filled 2019-10-23: qty 1

## 2019-10-23 NOTE — MAU Note (Signed)
Gina Lawson is a 28 y.o. here in MAU reporting: nov 1st had faint positive UPT, had some bleeding and vomiting after that. Has been bleeding intermittently since then, had some this morning and saturated 1 pad and since then she has only seen it when she wipes. Having abdominal pain also. States her abdomen has doubled in size over the last 2 days.  LMP: unknown- thinks sometime in September  Onset of complaint: ongoing  Pain score: 7/10  Vitals:   10/23/19 1744  BP: 106/63  Pulse: (!) 113  Resp: 16  Temp: 98.4 F (36.9 C)  SpO2: 100%     Lab orders placed from triage: UPT, UA

## 2019-10-23 NOTE — MAU Provider Note (Signed)
History    CSN: [id number]  Arrival date and time: 10/23/19 1724   Chief Complaint  Patient presents with  . Abdominal Pain  . Vaginal Bleeding   HPI Gina Lawson is a 28 y.o. G3P1011 in early pregnancy who presents to MAU with chief complaints of vaginal bleeding and abdominal pain. Patient reports a positive home pregnancy test on 09/11/2019. She endorses heavy vaginal bleeding with clots on 11/02 and 1103. The she experienced intermittent spotting since that time. As of this morning she only sees spotting when she wipes after voiding.   Her abdominal pain is a new problem, onset yesterday and worsening significantly today. Locus of pain is LLQ. She rates her pain as 7/10 and it radiates to her left lower back. She denies aggravating or alleviating factors. She took two Tylenol earlier today but did not experience relief.  Patient most recently ate at 4pm. She denies dysuria, fever, history of UTIs or kidney stones, recent illness. OB history term SVD 12 years ago followed by SAB in 2017.  OB History    Gravida  3   Para  1   Term  1   Preterm      AB  1   Living  1     SAB  1   TAB      Ectopic      Multiple      Live Births              Past Medical History:  Diagnosis Date  . Anxiety   . Cancer ([place])   . Fibromyalgia   . Herniated disc   . Hypertension   . Vitamin D deficiency     Past Surgical History:  Procedure Laterality Date  . APPENDECTOMY    . CHOLECYSTECTOMY    . HYSTEROSCOPY N/A 10/27/2016   Procedure: HYSTEROSCOPY, DILATATION AND CURETTAGE;  Surgeon: Sanjuana Kava, MD;  Location: [place];  Service: Gynecology;  Laterality: N/A;  . LAPAROSCOPY N/A 10/27/2016   Procedure: DIAGNOSTIC LAPAROSCOPY;  Surgeon: Sanjuana Kava, MD;  Location: [facility];  Service: Gynecology;  Laterality: N/A;    No family history on file.  Social History   Tobacco Use  . Smoking status: Light Tobacco Smoker    Packs/day:  0.50    Years: 12.00    Pack years: 6.00    Types: Cigarettes  . Smokeless tobacco: Never Used  Substance Use Topics  . Alcohol use: Yes    Comment: occ  . Drug use: No    Allergies:  Allergies  Allergen Reactions  . Penicillins Nausea And Vomiting    Has patient had a PCN reaction causing immediate rash, facial/tongue/throat swelling, SOB or lightheadedness with hypotension: No Has patient had a PCN reaction causing severe rash involving mucus membranes or skin necrosis: No Has patient had a PCN reaction that required hospitalization No Has patient had a PCN reaction occurring within the last 10 years: Yes If all of the above answers are "NO", then may proceed with Cephalosporin use.   . Tramadol Other (See Comments)    Light headed - PCP told her not to take it   . Adhesive [Tape] Other (See Comments)    redness  . Amoxicillin Nausea And Vomiting  . Dilaudid [Hydromorphone Hcl]     Head and body felt tight like she would pass out  . Morphine And Related Rash    Medications Prior to Admission  Medication Sig Dispense Refill Last Dose  .  albuterol (PROVENTIL HFA;VENTOLIN HFA) 108 (90 Base) MCG/ACT inhaler Inhale 1-2 puffs every 6 (six) hours as needed into the lungs for wheezing or shortness of breath. 1 Inhaler 0   . ALPRAZolam (XANAX) 1 MG tablet Take 0.5-1 tablets by mouth every 6 (six) hours as needed for anxiety.   3   . amphetamine-dextroamphetamine (ADDERALL) 20 MG tablet Take 20 tablets by mouth 2 (two) times daily as needed.  0   . azithromycin (ZITHROMAX) 250 MG tablet Take 1 tablet (250 mg total) daily by mouth. Take first 2 tablets together, then 1 every day until finished. 6 tablet 0   . benzonatate (TESSALON) 100 MG capsule Take 1-2 capsules (100-200 mg total) every 8 (eight) hours by mouth. 21 capsule 0   . cholecalciferol (VITAMIN D) 400 units TABS tablet Take 400 Units by mouth daily.     . cyclobenzaprine (FLEXERIL) 10 MG tablet Take 1 tablet (10 mg total) by  mouth 3 (three) times daily as needed for muscle spasms. 20 tablet 0   . IRON PO Take 1 tablet by mouth daily.     . Naproxen Sodium (ALEVE) 220 MG CAPS Take 440 mg by mouth daily as needed (for pain).      Marland Kitchen omeprazole (PRILOSEC) 40 MG capsule Take 1 capsule (40 mg total) by mouth 2 (two) times daily before a meal. 30 capsule 1   . ondansetron (ZOFRAN) 4 MG tablet Take 2 tablets (8 mg total) by mouth every 4 (four) hours as needed for nausea or vomiting. (Patient not taking: Reported on 06/18/2017) 20 tablet 0   . oxyCODONE-acetaminophen (PERCOCET) 7.5-325 MG tablet Take 1-2 tablets by mouth 3 (three) times daily as needed for moderate pain or severe pain.   0   . oxyCODONE-acetaminophen (PERCOCET/ROXICET) 5-325 MG tablet Take 1-2 tablets by mouth every 6 (six) hours as needed for severe pain. (Patient not taking: Reported on 06/18/2017) 12 tablet 0   . predniSONE (DELTASONE) 20 MG tablet 3 tabs po day one, then 2 po daily x 4 days 11 tablet 0   . promethazine-dextromethorphan (PROMETHAZINE-DM) 6.25-15 MG/5ML syrup Take 5 mLs 3 (three) times daily as needed by mouth for cough. Take with large glass of water 80 mL 0   . propranolol (INDERAL) 10 MG tablet Take 1 tablet (10 mg total) by mouth 4 (four) times daily as needed. Can take up to 2 tablets 4 times daily as needed. (Patient not taking: Reported on 06/18/2017) 40 tablet 0     Review of Systems  Constitutional: Negative for chills, fatigue and fever.  Respiratory: Negative for shortness of breath.   Gastrointestinal: Positive for abdominal pain.  Genitourinary: Positive for vaginal bleeding. Negative for difficulty urinating and dysuria.  Musculoskeletal: Negative for back pain.  Neurological: Negative for dizziness.  All other systems reviewed and are negative.  Physical Exam   Blood pressure 106/63, pulse (!) 113, temperature 98.4 F (36.9 C), temperature source Oral, resp. rate 16, height 5' 1.5" (1.562 m), weight 46.3 kg, SpO2 100  %.  Physical Exam  Nursing note and vitals reviewed. Constitutional: She is oriented to person, place, and time. She appears well-developed and well-nourished.  Cardiovascular: Normal rate.  Respiratory: Effort normal.  GI: Soft. She exhibits no distension and no mass. There is abdominal tenderness. There is no rebound, no guarding and no CVA tenderness.  Patient unable to tolerate speculum exam  Musculoskeletal:        General: Normal range of motion.  Neurological: She is  alert and oriented to person, place, and time.  Skin: Skin is warm and dry.  Psychiatric: She has a normal mood and affect. Her behavior is normal. Judgment and thought content normal.    MAU Course/MDM  Procedures  --Minimal pain relief with Tylenol. Patient specifically requesting Oxycodone. Reports she took 15mg  PRN for back pain for two years without allergic reaction.   Patient Vitals for the past 24 hrs:  BP Temp Temp src Pulse Resp SpO2 Height Weight  10/23/19 1930 93/76 98.5 F (36.9 C) Oral 89 17 100 % -- --  10/23/19 1744 106/63 98.4 F (36.9 C) Oral (!) 113 16 100 % -- --  10/23/19 1738 -- -- -- -- -- -- 5' 1.5" (1.562 m) 46.3 kg   Results for orders placed or performed during the hospital encounter of 10/23/19 (from the past 24 hour(s))  Pregnancy, urine POC     Status: Abnormal   Collection Time: 10/23/19  5:40 PM  Result Value Ref Range   Preg Test, Ur POSITIVE (A) NEGATIVE  Urinalysis, Routine w reflex microscopic     Status: Abnormal   Collection Time: 10/23/19  5:56 PM  Result Value Ref Range   Color, Urine YELLOW YELLOW   APPearance HAZY (A) CLEAR   Specific Gravity, Urine 1.019 1.005 - 1.030   pH 5.0 5.0 - 8.0   Glucose, UA NEGATIVE NEGATIVE mg/dL   Hgb urine dipstick NEGATIVE NEGATIVE   Bilirubin Urine NEGATIVE NEGATIVE   Ketones, ur NEGATIVE NEGATIVE mg/dL   Protein, ur NEGATIVE NEGATIVE mg/dL   Nitrite NEGATIVE NEGATIVE   Leukocytes,Ua MODERATE (A) NEGATIVE   RBC / HPF 0-5 0 -  5 RBC/hpf   WBC, UA 0-5 0 - 5 WBC/hpf   Bacteria, UA RARE (A) NONE SEEN   Squamous Epithelial / LPF 11-20 0 - 5   Mucus PRESENT   CBC     Status: Abnormal   Collection Time: 10/23/19  6:07 PM  Result Value Ref Range   WBC 11.0 (H) 4.0 - 10.5 K/uL   RBC 4.38 3.87 - 5.11 MIL/uL   Hemoglobin 13.3 12.0 - 15.0 g/dL   HCT 38.7 36.0 - 46.0 %   MCV 88.4 80.0 - 100.0 fL   MCH 30.4 26.0 - 34.0 pg   MCHC 34.4 30.0 - 36.0 g/dL   RDW 13.1 11.5 - 15.5 %   Platelets 363 150 - 400 K/uL   nRBC 0.0 0.0 - 0.2 %  Comprehensive metabolic panel     Status: Abnormal   Collection Time: 10/23/19  6:07 PM  Result Value Ref Range   Sodium 138 135 - 145 mmol/L   Potassium 3.7 3.5 - 5.1 mmol/L   Chloride 106 98 - 111 mmol/L   CO2 24 22 - 32 mmol/L   Glucose, Bld 104 (H) 70 - 99 mg/dL   BUN 12 6 - 20 mg/dL   Creatinine, Ser 0.64 0.44 - 1.00 mg/dL   Calcium 9.4 8.9 - 10.3 mg/dL   Total Protein 6.2 (L) 6.5 - 8.1 g/dL   Albumin 4.0 3.5 - 5.0 g/dL   AST 16 15 - 41 U/L   ALT 11 0 - 44 U/L   Alkaline Phosphatase 51 38 - 126 U/L   Total Bilirubin 0.5 0.3 - 1.2 mg/dL   GFR calc non Af Amer >60 >60 mL/min   GFR calc Af Amer >60 >60 mL/min   Anion gap 8 5 - 15  hCG, quantitative, pregnancy     Status:  Abnormal   Collection Time: 10/23/19  6:07 PM  Result Value Ref Range   hCG, Beta Chain, Quant, S 39,798 (H) <5 mIU/mL  ABO/Rh     Status: None   Collection Time: 10/23/19  6:20 PM  Result Value Ref Range   ABO/RH(D)      A POS Performed at [facility] [address], [place], [place] [address]    US OB LESS THAN 14 WEEKS WITH OB TRANSVAGINAL  Result Date: 10/23/2019 CLINICAL DATA:  Initial evaluation for acute vaginal bleeding, cramping. Early pregnancy. EXAM: OBSTETRIC <14 WK [place] AND TRANSVAGINAL OB US TECHNIQUE: Both transabdominal and transvaginal ultrasound examinations were performed for complete evaluation of the gestation as well as the maternal uterus, adnexal regions, and pelvic  cul-de-sac. Transvaginal technique was performed to assess early pregnancy. COMPARISON:  None. FINDINGS: Intrauterine gestational sac: Single Yolk sac:  Present Embryo:  Present Cardiac Activity: Present Heart Rate: 111 bpm CRL: 2.07 mm   5 w   5 d                  [place] EDC: 06/19/2020 Subchorionic hemorrhage: Small subchorionic hemorrhage without associated mass effect. Maternal uterus/adnexae: Ovaries are normal in appearance bilaterally. No adnexal mass. Small volume free physiologic fluid present within the pelvis. IMPRESSION: 1. Single viable intrauterine pregnancy as above, estimated gestational age [redacted] weeks and 5 days by crown-rump length, with ultrasound EDC of 06/19/2020. 2. Small subchorionic hemorrhage without associated mass effect. 3. No other acute maternal uterine or adnexal abnormality identified. Electronically Signed   By: Jeannine Boga M.D.   On: 10/23/2019 19:18   Assessment and Plan  --28 y.o. G3P1011 at [redacted]w[redacted]d by [place] --Subchorionic hemorrhage. Reviewed recommendation for pelvic rest --Blood type A POS --Discharge home in stable condition  F/U: --Desires pregnancy --Plan to initiate prenatal care around 11 weeks  Darlina Rumpf, CNM 10/23/2019, 7:43 PM

## 2019-10-23 NOTE — Discharge Instructions (Signed)
Safe Medications in Pregnancy   Acne: Benzoyl Peroxide Salicylic Acid  Backache/Headache: Tylenol: 2 regular strength every 4 hours OR              2 Extra strength every 6 hours  Colds/Coughs/Allergies: Benadryl (alcohol free) 25 mg every 6 hours as needed Breath right strips Claritin Cepacol throat lozenges Chloraseptic throat spray Cold-Eeze- up to three times per day Cough drops, alcohol free Flonase (by prescription only) Guaifenesin Mucinex Robitussin DM (plain only, alcohol free) Saline nasal spray/drops Sudafed (pseudoephedrine) & Actifed ** use only after [redacted] weeks gestation and if you do not have high blood pressure Tylenol Vicks Vaporub Zinc lozenges Zyrtec   Constipation: Colace Ducolax suppositories Fleet enema Glycerin suppositories Metamucil Milk of magnesia Miralax Senokot Smooth move tea  Diarrhea: Kaopectate Imodium A-D  *NO pepto Bismol  Hemorrhoids: Anusol Anusol HC Preparation H Tucks  Indigestion: Tums Maalox Mylanta Zantac  Pepcid  Insomnia: Benadryl (alcohol free) 25mg  every 6 hours as needed Tylenol PM Unisom, no Gelcaps  Leg Cramps: Tums MagGel  Nausea/Vomiting:  Bonine Dramamine Emetrol Ginger extract Sea bands Meclizine  Nausea medication to take during pregnancy:  Unisom (doxylamine succinate 25 mg tablets) Take one tablet daily at bedtime. If symptoms are not adequately controlled, the dose can be increased to a maximum recommended dose of two tablets daily (1/2 tablet in the morning, 1/2 tablet mid-afternoon and one at bedtime). Vitamin B6 100mg  tablets. Take one tablet twice a day (up to 200 mg per day).  Skin Rashes: Aveeno products Benadryl cream or 25mg  every 6 hours as needed Calamine Lotion 1% cortisone cream  Yeast infection: Gyne-lotrimin 7 Monistat 7   **If taking multiple medications, please check labels to avoid duplicating the same active ingredients **take medication as directed on  the label ** Do not exceed 4000 mg of tylenol in 24 hours **Do not take medications that contain aspirin or ibuprofen     First Trimester of Pregnancy  The first trimester of pregnancy is from week 1 until the end of week 13 (months 1 through 3). During this time, your baby will begin to develop inside you. At 6-8 weeks, the eyes and face are formed, and the heartbeat can be seen on ultrasound. At the end of 12 weeks, all the baby's organs are formed. Prenatal care is all the medical care you receive before the birth of your baby. Make sure you get good prenatal care and follow all of your doctor's instructions. Follow these instructions at home: Medicines  Take over-the-counter and prescription medicines only as told by your doctor. Some medicines are safe and some medicines are not safe during pregnancy.  Take a prenatal vitamin that contains at least 600 micrograms (mcg) of folic acid.  If you have trouble pooping (constipation), take medicine that will make your stool soft (stool softener) if your doctor approves. Eating and drinking   Eat regular, healthy meals.  Your doctor will tell you the amount of weight gain that is right for you.  Avoid raw meat and uncooked cheese.  If you feel sick to your stomach (nauseous) or throw up (vomit): ? Eat 4 or 5 small meals a day instead of 3 large meals. ? Try eating a few soda crackers. ? Drink liquids between meals instead of during meals.  To prevent constipation: ? Eat foods that are high in fiber, like fresh fruits and vegetables, whole grains, and beans. ? Drink enough fluids to keep your pee (urine) clear or pale yellow.  Activity  Exercise only as told by your doctor. Stop exercising if you have cramps or pain in your lower belly (abdomen) or low back.  Do not exercise if it is too hot, too humid, or if you are in a place of great height (high altitude).  Try to avoid standing for long periods of time. Move your legs often  if you must stand in one place for a long time.  Avoid heavy lifting.  Wear low-heeled shoes. Sit and stand up straight.  You can have sex unless your doctor tells you not to. Relieving pain and discomfort  Wear a good support bra if your breasts are sore.  Take warm water baths (sitz baths) to soothe pain or discomfort caused by hemorrhoids. Use hemorrhoid cream if your doctor says it is okay.  Rest with your legs raised if you have leg cramps or low back pain.  If you have puffy, bulging veins (varicose veins) in your legs: ? Wear support hose or compression stockings as told by your doctor. ? Raise (elevate) your feet for 15 minutes, 3-4 times a day. ? Limit salt in your food. Prenatal care  Schedule your prenatal visits by the twelfth week of pregnancy.  Write down your questions. Take them to your prenatal visits.  Keep all your prenatal visits as told by your doctor. This is important. Safety  Wear your seat belt at all times when driving.  Make a list of emergency phone numbers. The list should include numbers for family, friends, the hospital, and police and fire departments. General instructions  Ask your doctor for a referral to a local prenatal class. Begin classes no later than at the start of month 6 of your pregnancy.  Ask for help if you need counseling or if you need help with nutrition. Your doctor can give you advice or tell you where to go for help.  Do not use hot tubs, steam rooms, or saunas.  Do not douche or use tampons or scented sanitary pads.  Do not cross your legs for long periods of time.  Avoid all herbs and alcohol. Avoid drugs that are not approved by your doctor.  Do not use any tobacco products, including cigarettes, chewing tobacco, and electronic cigarettes. If you need help quitting, ask your doctor. You may get counseling or other support to help you quit.  Avoid cat litter boxes and soil used by cats. These carry germs that can  cause birth defects in the baby and can cause a loss of your baby (miscarriage) or stillbirth.  Visit your dentist. At home, brush your teeth with a soft toothbrush. Be gentle when you floss. Contact a doctor if:  You are dizzy.  You have mild cramps or pressure in your lower belly.  You have a nagging pain in your belly area.  You continue to feel sick to your stomach, you throw up, or you have watery poop (diarrhea).  You have a bad smelling fluid coming from your vagina.  You have pain when you pee (urinate).  You have increased puffiness (swelling) in your face, hands, legs, or ankles. Get help right away if:  You have a fever.  You are leaking fluid from your vagina.  You have spotting or bleeding from your vagina.  You have very bad belly cramping or pain.  You gain or lose weight rapidly.  You throw up blood. It may look like coffee grounds.  You are around people who have [place] measles, fifth disease,  or chickenpox.  You have a very bad headache.  You have shortness of breath.  You have any kind of trauma, such as from a fall or a car accident. Summary  The first trimester of pregnancy is from week 1 until the end of week 13 (months 1 through 3).  To take care of yourself and your unborn baby, you will need to eat healthy meals, take medicines only if your doctor tells you to do so, and do activities that are safe for you and your baby.  Keep all follow-up visits as told by your doctor. This is important as your doctor will have to ensure that your baby is healthy and growing well. This information is not intended to replace advice given to you by your health care provider. Make sure you discuss any questions you have with your health care provider. Document Released: 04/14/2008 Document Revised: 02/17/2019 Document Reviewed: 11/04/2016 Elsevier Patient Education  2020 [employer].

## 2019-10-24 LAB — ABO/RH: ABO/RH(D): A POS

## 2019-10-25 LAB — CULTURE, OB URINE: Culture: 10000 — AB

## 2020-01-17 ENCOUNTER — Emergency Department (INDEPENDENT_AMBULATORY_CARE_PROVIDER_SITE_OTHER)
Admission: EM | Admit: 2020-01-17 | Discharge: 2020-01-17 | Disposition: A | Discharge status: Home / Self Care | Payer: Medicaid Other | Source: Home / Self Care | Attending: Family Medicine | Admitting: Family Medicine

## 2020-01-17 ENCOUNTER — Other Ambulatory Visit: Payer: Self-pay

## 2020-01-17 DIAGNOSIS — S20211A Contusion of right front wall of thorax, initial encounter: Secondary | ICD-10-CM

## 2020-01-17 DIAGNOSIS — Z3490 Encounter for supervision of normal pregnancy, unspecified, unspecified trimester: Secondary | ICD-10-CM

## 2020-01-17 DIAGNOSIS — Z8759 Personal history of other complications of pregnancy, childbirth and the puerperium: Secondary | ICD-10-CM | POA: Diagnosis not present

## 2020-01-17 LAB — POCT URINE PREGNANCY: Preg Test, Ur: NEGATIVE

## 2020-01-17 NOTE — Discharge Instructions (Signed)
Apply ice pack to right upper chest for 20 to 30 minutes, 3 to 4 times daily  Continue until pain and swelling decrease.  May take Tylenol as needed for pain.

## 2020-01-17 NOTE — ED Provider Notes (Signed)
[facility]    CSN: [id number] Arrival date & time: 01/17/20  1154      History   Chief Complaint Chief Complaint  Patient presents with  . Injury    chest pain     HPI Gina Lawson is a 29 y.o. female.   Patient presents with two complaints: 1)  Two days ago she jumped on her bed, forgetting that a large jewelry box.  Her right upper anterior chest hit the box and she has had persistent pain at that spot without swelling or bruising.  She has had pain with inspiration but no shortness of breath. 2)  Patient reports that she had a fetal loss on 17 November 2019.  About a week ago she checked a pregnancy test that was positive.  She has repeated the tests numerous times, with the last test being negative.  She denies abdominal pain.  Patient's last menstrual period was 12/13/2019 (approximate).   The history is provided by the patient.  Injury This is a new problem. The current episode started 2 days ago. The problem occurs constantly. The problem has been gradually improving. Associated symptoms include chest pain. Pertinent negatives include no abdominal pain and no shortness of breath. The symptoms are aggravated by twisting (inspiration). Nothing relieves the symptoms. She has tried nothing for the symptoms.    Past Medical History:  Diagnosis Date  . Anxiety   . Cancer ([place])   . Fibromyalgia   . Herniated disc   . Hypertension   . Migraines   . Vitamin D deficiency     Patient Active Problem List   Diagnosis Date Noted  . Intrauterine pregnancy 10/23/2019  . Subchorionic hematoma in first trimester 10/23/2019    Past Surgical History:  Procedure Laterality Date  . APPENDECTOMY    . CHOLECYSTECTOMY    . HYSTEROSCOPY N/A 10/27/2016   Procedure: HYSTEROSCOPY, DILATATION AND CURETTAGE;  Surgeon: Sanjuana Kava, MD;  Location: [facility];  Service: Gynecology;  Laterality: N/A;  . LAPAROSCOPY N/A 10/27/2016   Procedure: DIAGNOSTIC LAPAROSCOPY;   Surgeon: Sanjuana Kava, MD;  Location: [facility];  Service: Gynecology;  Laterality: N/A;    OB History    Gravida  3   Para  1   Term  1   Preterm      AB  1   Living  1     SAB  1   TAB      Ectopic      Multiple      Live Births               Home Medications    Prior to Admission medications   Medication Sig Start Date End Date Taking? Authorizing Provider  predniSONE (DELTASONE) 20 MG tablet One tab with breakfast and one tab with lunch 08/22/19  Yes [provider]  ALPRAZolam (XANAX) 1 MG tablet Take 0.5-1 mg by mouth every 6 (six) hours as needed. 01/09/20   [provider]  amphetamine-dextroamphetamine (ADDERALL) 30 MG tablet Take 1 tablet by mouth 2 (two) times daily. 12/18/19   [provider]  aspirin-acetaminophen-caffeine (EXCEDRIN MIGRAINE) [phone] MG tablet Take by mouth.    [provider]  HYDROcodone-acetaminophen (NORCO/VICODIN) 5-325 MG tablet hydrocodone 5 mg-acetaminophen 325 mg tablet  TK 1 T PO Q 6 H PRF 5 DAYS    [provider]  lamoTRIgine (LAMICTAL) 150 MG tablet Take 150 mg by mouth 2 (two) times daily. 01/01/20  [provider]  oxyCODONE (ROXICODONE) 15 MG immediate release tablet Take 15 mg by mouth every 8 (eight) hours as needed. 01/05/20   [provider]    Family History History reviewed. No pertinent family history.  Social History Social History   Tobacco Use  . Smoking status: Light Tobacco Smoker    Packs/day: 0.50    Years: 12.00    Pack years: 6.00    Types: Cigarettes  . Smokeless tobacco: Never Used  Substance Use Topics  . Alcohol use: Yes    Comment: occ  . Drug use: No     Allergies   Penicillins, Tramadol, Amoxicillin, Dilaudid [hydromorphone hcl], and Morphine and related   Review of Systems Review of Systems  Constitutional: Negative for appetite change, chills, diaphoresis, fatigue and fever.  HENT: Negative.     Eyes: Negative.   Respiratory: Positive for chest tightness. Negative for cough, shortness of breath and wheezing.   Cardiovascular: Positive for chest pain.  Gastrointestinal: Negative for abdominal pain.  Genitourinary: Negative.   Musculoskeletal: Negative.   Skin: Negative.   Neurological: Negative.      Physical Exam Triage Vital Signs ED Triage Vitals  Enc Vitals Group     BP 01/17/20 1230 128/78     Pulse Rate 01/17/20 1230 (!) 103     Resp 01/17/20 1230 20     Temp 01/17/20 1230 98.1 F (36.7 C)     Temp Source 01/17/20 1230 Oral     SpO2 01/17/20 1230 100 %     Weight 01/17/20 1231 97 lb (44 kg)     Height 01/17/20 1231 5\' 1"  (1.549 m)     Head Circumference --      Peak Flow --      Pain Score 01/17/20 1230 0     Pain Loc --      Pain Edu? --      Excl. in [place]? --    No data found.  Updated Vital Signs BP 128/78 (BP Location: Right Arm)   Pulse (!) 103   Temp 98.1 F (36.7 C) (Oral)   Resp 20   Ht 5\' 1"  (1.549 m)   Wt 44 kg   LMP 12/13/2019 (Approximate)   SpO2 100%   BMI 18.33 kg/m   Visual Acuity Right Eye Distance:   Left Eye Distance:   Bilateral Distance:    Right Eye Near:   Left Eye Near:    Bilateral Near:     Physical Exam Vitals and nursing note reviewed.  Constitutional:      General: She is not in acute distress. HENT:     Nose: Nose normal.     Mouth/Throat:     Mouth: Mucous membranes are moist.     Pharynx: Oropharynx is clear.  Eyes:     Pupils: Pupils are equal, round, and reactive to light.  Cardiovascular:     Rate and Rhythm: Tachycardia present.     Heart sounds: Normal heart sounds.  Pulmonary:     Breath sounds: Normal breath sounds.  Chest:       Comments: Localized tenderness to palpation right upper chest  as noted on diagram.  No ecchymosis, erythema, or swelling. Abdominal:     General: Abdomen is flat.     Tenderness: There is no abdominal tenderness.  Musculoskeletal:     Cervical back: Normal  range of motion and neck supple.     Right lower leg: No edema.     Left  lower leg: No edema.  Lymphadenopathy:     Cervical: No cervical adenopathy.  Skin:    General: Skin is warm and dry.  Neurological:     Mental Status: She is alert.      UC Treatments / Results  Labs (all labs ordered are listed, but only abnormal results are displayed) Labs Reviewed  HCG, QUANTITATIVE, PREGNANCY  POCT URINE PREGNANCY negative    EKG   Radiology No results found.  Procedures Procedures (including critical care time)  Medications Ordered in UC Medications - No data to display  Initial Impression / Assessment and Plan / UC Course  I have reviewed the triage vital signs and the nursing notes.  Pertinent labs & imaging results that were available during my care of the patient were reviewed by me and considered in my medical decision making (see chart for details).    Check quantitative HCG. Exam consistent with chest contusion.   Followup with Family Doctor if not improved in about 2 weeks.    Final Clinical Impressions(s) / UC Diagnoses   Final diagnoses:  History of fetal loss  Contusion, chest wall, right, initial encounter     Discharge Instructions     Apply ice pack to right upper chest for 20 to 30 minutes, 3 to 4 times daily  Continue until pain and swelling decrease.  May take Tylenol as needed for pain.   ED Prescriptions    None        Kandra Nicolas, MD 01/17/20 1531

## 2020-01-17 NOTE — ED Triage Notes (Addendum)
Pt has had 17 pregnancy tests at home. 12 of them were positive. 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 Would like to be tested today in clinic. She also stated that 2 days ago, while getting in bed she laid on a jewlery box . Since then she has experienced pain that R chest and radiates to R upper back. Pt denies having any symptoms of a heart attack and associates pain with the injury. She has also been under a lot of stress lately.

## 2020-01-18 LAB — HCG, QUANTITATIVE, PREGNANCY: HCG, Total, QN: 5 m[IU]/mL

## 2020-09-01 ENCOUNTER — Encounter (HOSPITAL_COMMUNITY): Payer: Self-pay

## 2020-09-01 ENCOUNTER — Other Ambulatory Visit: Payer: Self-pay

## 2020-09-01 ENCOUNTER — Ambulatory Visit (HOSPITAL_COMMUNITY)
Admission: EM | Admit: 2020-09-01 | Discharge: 2020-09-01 | Disposition: A | Discharge status: Home / Self Care | Payer: Medicaid Other | Attending: Family Medicine | Admitting: Family Medicine

## 2020-09-01 DIAGNOSIS — J069 Acute upper respiratory infection, unspecified: Secondary | ICD-10-CM

## 2020-09-01 MED ORDER — PREDNISONE 20 MG PO TABS: 40.0000 mg | ORAL_TABLET | Freq: Every day | ORAL | 0 refills | Status: DC

## 2020-09-01 MED ORDER — LIDOCAINE VISCOUS HCL 2 % MT SOLN: 10.0000 mL | OROMUCOSAL | 0 refills | Status: DC | PRN

## 2020-09-01 NOTE — ED Triage Notes (Signed)
Pt declines Covid test.

## 2020-09-01 NOTE — ED Notes (Signed)
Pt refused covid swab. °

## 2020-09-01 NOTE — ED Provider Notes (Signed)
[facility]    CSN: [id number] Arrival date & time: 09/01/20  1219      History   Chief Complaint Chief Complaint  Patient presents with  . Ear Fullness  . Dizziness  . Generalized Body Aches  . Nasal Drainage    HPI Gina Lawson is a 29 y.o. female.   Here today with about 4 day history of cough, sore throat, ear pressure and pain, nasal congestion, body aches, fatigue. Ears and throat are the most bothersome for her at this time. She denies known fever, chills, abdominal pain, N/V/D, CP, SOB, known sick contacts or recent travel. So far taking sudafed, alka seltzer cold and flu, zyrtec, flonase with temporary relief. Hx of allergic rhinitis, no known chronic pulmonary issues.      Past Medical History:  Diagnosis Date  . Anxiety   . Cancer ([place])   . Fibromyalgia   . Herniated disc   . Hypertension   . Migraines   . Vitamin D deficiency     Patient Active Problem List   Diagnosis Date Noted  . Intrauterine pregnancy 10/23/2019  . Subchorionic hematoma in first trimester 10/23/2019    Past Surgical History:  Procedure Laterality Date  . APPENDECTOMY    . CHOLECYSTECTOMY    . HYSTEROSCOPY N/A 10/27/2016   Procedure: HYSTEROSCOPY, DILATATION AND CURETTAGE;  Surgeon: Sanjuana Kava, MD;  Location: [facility];  Service: Gynecology;  Laterality: N/A;  . LAPAROSCOPY N/A 10/27/2016   Procedure: DIAGNOSTIC LAPAROSCOPY;  Surgeon: Sanjuana Kava, MD;  Location: [facility];  Service: Gynecology;  Laterality: N/A;    OB History    Gravida  3   Para  1   Term  1   Preterm      AB  1   Living  1     SAB  1   TAB      Ectopic      Multiple      Live Births               Home Medications    Prior to Admission medications   Medication Sig Start Date End Date Taking? Authorizing Provider  ALPRAZolam Duanne Moron) 1 MG tablet Take 0.5-1 mg by mouth every 6 (six) hours as needed. 01/09/20   [provider]   amphetamine-dextroamphetamine (ADDERALL) 30 MG tablet Take 1 tablet by mouth 2 (two) times daily. 12/18/19   [provider]  aspirin-acetaminophen-caffeine (EXCEDRIN MIGRAINE) [phone] MG tablet Take by mouth.    [provider]  HYDROcodone-acetaminophen (NORCO/VICODIN) 5-325 MG tablet hydrocodone 5 mg-acetaminophen 325 mg tablet  TK 1 T PO Q 6 H PRF 5 DAYS    [provider]  lamoTRIgine (LAMICTAL) 150 MG tablet Take 150 mg by mouth 2 (two) times daily. 01/01/20   [provider]  lidocaine (XYLOCAINE) 2 % solution Use as directed 10 mLs in the mouth or throat as needed for mouth pain. 09/01/20   Volney American, PA-C  oxyCODONE (ROXICODONE) 15 MG immediate release tablet Take 15 mg by mouth every 8 (eight) hours as needed. 01/05/20   [provider]  predniSONE (DELTASONE) 20 MG tablet One tab with breakfast and one tab with lunch 08/22/19   [provider]  predniSONE (DELTASONE) 20 MG tablet Take 2 tablets (40 mg total) by mouth daily with breakfast. 09/01/20   Volney American, PA-C    Family History Family History  Family history unknown: Yes    Social  History Social History   Tobacco Use  . Smoking status: Light Tobacco Smoker    Packs/day: 0.50    Years: 12.00    Pack years: 6.00    Types: Cigarettes  . Smokeless tobacco: Never Used  Substance Use Topics  . Alcohol use: Yes    Comment: occ  . Drug use: No     Allergies   Penicillins, Tramadol, Amoxicillin, Dilaudid [hydromorphone hcl], and Morphine and related   Review of Systems Review of Systems PER HPI    Physical Exam Triage Vital Signs ED Triage Vitals  Enc Vitals Group     BP 09/01/20 1303 116/79     Pulse Rate 09/01/20 1303 (!) 106     Resp 09/01/20 1303 17     Temp 09/01/20 1303 98.1 F (36.7 C)     Temp Source 09/01/20 1303 Oral     SpO2 09/01/20 1303 99 %     Weight --      Height --      Head Circumference --      Peak Flow  --      Pain Score 09/01/20 1300 6     Pain Loc --      Pain Edu? --      Excl. in [place]? --    No data found.  Updated Vital Signs BP 116/79 (BP Location: Right Arm)   Pulse (!) 106   Temp 98.1 F (36.7 C) (Oral)   Resp 17   LMP 08/11/2020   SpO2 99%   Breastfeeding Unknown   Visual Acuity Right Eye Distance:   Left Eye Distance:   Bilateral Distance:    Right Eye Near:   Left Eye Near:    Bilateral Near:     Physical Exam Vitals and nursing note reviewed.  Constitutional:      Appearance: Normal appearance. She is not ill-appearing.  HENT:     Head: Atraumatic.     Ears:     Comments: Cerumen build up in b/l ears, left worse than right. Middle ear effusions present b/l    Nose: Rhinorrhea present.     Mouth/Throat:     Mouth: Mucous membranes are moist.     Pharynx: Posterior oropharyngeal erythema present.  Eyes:     Extraocular Movements: Extraocular movements intact.     Conjunctiva/sclera: Conjunctivae normal.  Cardiovascular:     Rate and Rhythm: Normal rate and regular rhythm.     Heart sounds: Normal heart sounds.  Pulmonary:     Effort: Pulmonary effort is normal.     Breath sounds: Normal breath sounds.  Abdominal:     General: Bowel sounds are normal. There is no distension.     Palpations: Abdomen is soft.     Tenderness: There is no abdominal tenderness. There is no guarding.  Musculoskeletal:        General: Normal range of motion.     Cervical back: Normal range of motion and neck supple.  Skin:    General: Skin is warm and dry.  Neurological:     Mental Status: She is alert and oriented to person, place, and time.  Psychiatric:        Mood and Affect: Mood normal.        Thought Content: Thought content normal.        Judgment: Judgment normal.     UC Treatments / Results  Labs (all labs ordered are listed, but only abnormal results are displayed) Labs Reviewed - No  data to display  EKG   Radiology No results  found.  Procedures Procedures (including critical care time)  Medications Ordered in UC Medications - No data to display  Initial Impression / Assessment and Plan / UC Course  I have reviewed the triage vital signs and the nursing notes.  Pertinent labs & imaging results that were available during my care of the patient were reviewed by me and considered in my medical decision making (see chart for details).     Consistent with viral illness. She declines any respiratory testing today. Isolation protocol reviewed with patient. Will tx with prednisone burst, continued allergy and OTC medication regimen for comfort, viscous lidocaine for throat pain. F/u if sxs worsening or not resolving.  Final Clinical Impressions(s) / UC Diagnoses   Final diagnoses:  Viral URI   Discharge Instructions   None    ED Prescriptions    Medication Sig Dispense Auth. Provider   predniSONE (DELTASONE) 20 MG tablet Take 2 tablets (40 mg total) by mouth daily with breakfast. 10 tablet Volney American, PA-C   lidocaine (XYLOCAINE) 2 % solution Use as directed 10 mLs in the mouth or throat as needed for mouth pain. 100 mL Volney American, [place]     PDMP not reviewed this encounter.   Volney American, [place] 09/01/20 1413

## 2020-09-01 NOTE — ED Triage Notes (Signed)
Pt presents with bilateral ear fullness, nasal congestion, dizziness, headache, and generalized body aches for the past few days.

## 2021-11-04 ENCOUNTER — Emergency Department: Admit: 2021-11-04 | Discharge status: Absent without leave | Payer: Self-pay

## 2021-11-13 ENCOUNTER — Encounter (HOSPITAL_BASED_OUTPATIENT_CLINIC_OR_DEPARTMENT_OTHER): Payer: Self-pay

## 2021-11-13 ENCOUNTER — Other Ambulatory Visit: Payer: Self-pay

## 2021-11-13 ENCOUNTER — Emergency Department (HOSPITAL_BASED_OUTPATIENT_CLINIC_OR_DEPARTMENT_OTHER): Payer: Medicaid Other

## 2021-11-13 ENCOUNTER — Emergency Department (HOSPITAL_BASED_OUTPATIENT_CLINIC_OR_DEPARTMENT_OTHER)
Admission: EM | Admit: 2021-11-13 | Discharge: 2021-11-13 | Disposition: A | Discharge status: Home / Self Care | Payer: Medicaid Other | Attending: Emergency Medicine | Admitting: Emergency Medicine

## 2021-11-13 DIAGNOSIS — R000 Tachycardia, unspecified: Secondary | ICD-10-CM | POA: Diagnosis not present

## 2021-11-13 DIAGNOSIS — Z20822 Contact with and (suspected) exposure to covid-19: Secondary | ICD-10-CM | POA: Insufficient documentation

## 2021-11-13 DIAGNOSIS — Z7982 Long term (current) use of aspirin: Secondary | ICD-10-CM | POA: Diagnosis not present

## 2021-11-13 DIAGNOSIS — R059 Cough, unspecified: Secondary | ICD-10-CM | POA: Insufficient documentation

## 2021-11-13 DIAGNOSIS — R1310 Dysphagia, unspecified: Secondary | ICD-10-CM | POA: Diagnosis not present

## 2021-11-13 DIAGNOSIS — R590 Localized enlarged lymph nodes: Secondary | ICD-10-CM | POA: Insufficient documentation

## 2021-11-13 DIAGNOSIS — M542 Cervicalgia: Secondary | ICD-10-CM | POA: Insufficient documentation

## 2021-11-13 DIAGNOSIS — R0602 Shortness of breath: Secondary | ICD-10-CM | POA: Diagnosis not present

## 2021-11-13 DIAGNOSIS — R0789 Other chest pain: Secondary | ICD-10-CM | POA: Diagnosis not present

## 2021-11-13 DIAGNOSIS — R591 Generalized enlarged lymph nodes: Secondary | ICD-10-CM

## 2021-11-13 DIAGNOSIS — E041 Nontoxic single thyroid nodule: Secondary | ICD-10-CM

## 2021-11-13 LAB — CBC WITH DIFFERENTIAL/PLATELET
Abs Immature Granulocytes: 0.09 10*3/uL — ABNORMAL HIGH (ref 0.00–0.07)
Basophils Absolute: 0.1 10*3/uL (ref 0.0–0.1)
Basophils Relative: 0 %
Eosinophils Absolute: 0.1 10*3/uL (ref 0.0–0.5)
Eosinophils Relative: 1 %
HCT: 39.4 % (ref 36.0–46.0)
Hemoglobin: 13.5 g/dL (ref 12.0–15.0)
Immature Granulocytes: 1 %
Lymphocytes Relative: 16 %
Lymphs Abs: 2.4 10*3/uL (ref 0.7–4.0)
MCH: 29.7 pg (ref 26.0–34.0)
MCHC: 34.3 g/dL (ref 30.0–36.0)
MCV: 86.6 fL (ref 80.0–100.0)
Monocytes Absolute: 0.7 10*3/uL (ref 0.1–1.0)
Monocytes Relative: 5 %
Neutro Abs: 11.8 10*3/uL — ABNORMAL HIGH (ref 1.7–7.7)
Neutrophils Relative %: 77 %
Platelets: 472 10*3/uL — ABNORMAL HIGH (ref 150–400)
RBC: 4.55 MIL/uL (ref 3.87–5.11)
RDW: 13.6 % (ref 11.5–15.5)
WBC: 15.2 10*3/uL — ABNORMAL HIGH (ref 4.0–10.5)
nRBC: 0 % (ref 0.0–0.2)

## 2021-11-13 LAB — BASIC METABOLIC PANEL
Anion gap: 9 (ref 5–15)
BUN: 17 mg/dL (ref 6–20)
CO2: 23 mmol/L (ref 22–32)
Calcium: 9.6 mg/dL (ref 8.9–10.3)
Chloride: 101 mmol/L (ref 98–111)
Creatinine, Ser: 0.71 mg/dL (ref 0.44–1.00)
GFR, Estimated: >60 mL/min (ref >60)
Glucose, Bld: 94 mg/dL (ref 70–99)
Potassium: 4.3 mmol/L (ref 3.5–5.1)
Sodium: 133 mmol/L — ABNORMAL LOW (ref 135–145)

## 2021-11-13 LAB — RESP PANEL BY RT-PCR (FLU A&B, COVID) ARPGX2
Influenza A by PCR: NEGATIVE
Influenza B by PCR: NEGATIVE
SARS Coronavirus 2 by RT PCR: NEGATIVE

## 2021-11-13 MED ORDER — IOHEXOL 300 MG/ML  SOLN
100.0000 mL | Freq: Once | INTRAMUSCULAR | Status: AC | PRN
  Administered 2021-11-13: 100 mL via INTRAVENOUS

## 2021-11-13 MED ORDER — ACETAMINOPHEN 325 MG PO TABS
650.0000 mg | ORAL_TABLET | Freq: Once | ORAL | Status: AC
  Administered 2021-11-13: 650 mg via ORAL
  Filled 2021-11-13: qty 2

## 2021-11-13 NOTE — ED Provider Notes (Signed)
[facility] Provider Note   CSN: [id number] Arrival date & time: 11/13/21  1415     History  Chief Complaint  Patient presents with   Neck Pain    Gina Lawson is a 31 y.o. female.  HPI  Patient presents with neck pain.  This started about a week and a half ago, is progressively worsening.  Is worse never she tries opening her mouth, she is having dysphagia.  Feels like she is choking when she is not.  She reports she is also feeling short of breath and having chest tightness whenever she tries to swallow.  Denies any fever, does have an intermittent cough.  Has tried over-the-counter medicine.  Patient also reports pain in her ears for 6 months, supposed follow-up with ENT but referral was not made by her PCP yet.  Home Medications Prior to Admission medications   Medication Sig Start Date End Date Taking? Authorizing Provider  ALPRAZolam Duanne Moron) 1 MG tablet Take 0.5-1 mg by mouth every 6 (six) hours as needed. 01/09/20   [provider]  amphetamine-dextroamphetamine (ADDERALL) 30 MG tablet Take 1 tablet by mouth 2 (two) times daily. 12/18/19   [provider]  aspirin-acetaminophen-caffeine (EXCEDRIN MIGRAINE) [phone] MG tablet Take by mouth.    [provider]  HYDROcodone-acetaminophen (NORCO/VICODIN) 5-325 MG tablet hydrocodone 5 mg-acetaminophen 325 mg tablet  TK 1 T PO Q 6 H PRF 5 DAYS    [provider]  lamoTRIgine (LAMICTAL) 150 MG tablet Take 150 mg by mouth 2 (two) times daily. 01/01/20   [provider]  lidocaine (XYLOCAINE) 2 % solution Use as directed 10 mLs in the mouth or throat as needed for mouth pain. 09/01/20   Volney American, PA-C  oxyCODONE (ROXICODONE) 15 MG immediate release tablet Take 15 mg by mouth every 8 (eight) hours as needed. 01/05/20   [provider]  predniSONE (DELTASONE) 20 MG tablet One tab with breakfast and one tab with lunch 08/22/19   [provider]  predniSONE (DELTASONE) 20 MG tablet Take 2 tablets (40 mg total) by mouth daily with breakfast. 09/01/20   Volney American, PA-C      Allergies    Penicillins, Tramadol, Amoxicillin, Dilaudid [hydromorphone hcl], and Morphine and related    Review of Systems   Review of Systems  Constitutional:  Negative for fever.  HENT:  Positive for ear pain and trouble swallowing.   Respiratory:  Positive for cough and shortness of breath.   Cardiovascular:  Positive for chest pain.  Gastrointestinal:  Negative for abdominal pain.  Musculoskeletal:  Positive for neck pain. Negative for back pain and neck stiffness.  Allergic/Immunologic: Negative for immunocompromised state.  Neurological:  Negative for syncope.  Hematological:  Negative for adenopathy. Does not bruise/bleed easily.   Physical Exam Updated Vital Signs BP (!) 147/92    Pulse (!) 101    Temp 98.4 F (36.9 C)    Resp 15    Ht 5\' 1"  (1.549 m)    Wt 48.1 kg    LMP 11/06/2021 (Approximate)    SpO2 99%    BMI 20.03 kg/m  Physical Exam Vitals and nursing note reviewed. Exam conducted with a chaperone present.  Constitutional:      Appearance: Normal appearance.  HENT:     Head: Normocephalic and atraumatic.  Eyes:     General: No scleral icterus.       Right eye: No discharge.  Left eye: No discharge.     Extraocular Movements: Extraocular movements intact.     Pupils: Pupils are equal, round, and reactive to light.  Neck:     Comments: Decreased range of motion secondary to pain/effort.  There is anterior cervical adenopathy, tenderness with palpation.  No palpable masses, thyroid roughly symmetric. Cardiovascular:     Rate and Rhythm: Regular rhythm. Tachycardia present.     Pulses: Normal pulses.     Heart sounds: Normal heart sounds. No murmur heard.   No friction rub. No gallop.  Pulmonary:     Effort: Pulmonary effort is normal. No respiratory distress.     Breath sounds: Normal breath  sounds.  Abdominal:     General: Abdomen is flat. Bowel sounds are normal. There is no distension.     Palpations: Abdomen is soft.     Tenderness: There is no abdominal tenderness.  Musculoskeletal:     Cervical back: Tenderness present.  Lymphadenopathy:     Cervical: Cervical adenopathy present.  Skin:    General: Skin is warm and dry.     Coloration: Skin is not jaundiced.  Neurological:     Mental Status: She is alert. Mental status is at baseline.     Coordination: Coordination normal.   ED Results / Procedures / Treatments   Labs (all labs ordered are listed, but only abnormal results are displayed) Labs Reviewed  RESP PANEL BY RT-PCR (FLU A&B, COVID) [id number]  BASIC METABOLIC PANEL  CBC WITH DIFFERENTIAL/PLATELET    EKG None  Radiology No results found.  Procedures Procedures    Medications Ordered in ED Medications - No data to display  ED Course/ Medical Decision Making/ A&P                           Medical Decision Making Patient is a 31 year old female with noncontributory medical history presenting due to viral symptoms and cervical lymphadenopathy.  Is been going on for the last few weeks, she is not febrile or tachycardic.  Her vitals are stable and she is not hypoxic.  On exam she does have palpable lymphadenopathy to the anterior and posterior cervical chain.  Her airway is clear, uvula is midline.  Handling secretions well, she is very tender over the thyroid and throat diffusely.  Basic labs ordered, her viral panel was negative.  I also proceeded with CT of temporal bones to assess for mastoiditis given her recurrent ear infections and tenderness to that area.  CT soft tissue also ordered.  It was notable for possible thyroid nodule as well as proliferative lymphadenopathy.  I suspect it is likely reactionary, she does have a leukocytosis with a left shift on her CBC.  No gross electrolyte derangement.  Information provided for additional evaluation from  heme-onc as needed.  Also given information for secondary primary care opinion if she is having difficulties getting in with her vaginal.  I advised her that she is going to need thyroid ultrasound and follow-up.  Patient discharged in stable condition.  Amount and/or Complexity of Data Reviewed Labs:  Decision-making details documented in ED Course. Radiology:  Decision-making details documented in ED Course.  Risk OTC drugs. Prescription drug management. Decision regarding hospitalization.            Final Clinical Impression(s) / ED Diagnoses Final diagnoses:  None    Rx / DC Orders ED Discharge Orders     None  Sherrill Raring, PA-C 11/13/21 2015    Fredia Sorrow, MD 11/21/21 2337

## 2021-11-13 NOTE — ED Notes (Signed)
Pt transported to CT ?

## 2021-11-13 NOTE — ED Notes (Signed)
D/c paperwork reviewed with pt, includi\ng f/u care. Pt with no questions or concerns at time of d/c. Ambulatory to ED exit without assistance.

## 2021-11-13 NOTE — ED Triage Notes (Addendum)
Pt c/o left/right side of neck, swelling to left side of neck x 1 week-states she had had multiple ear infection x 6-7 months-also c/o fever, sob, heart racing-NAD-steady gait

## 2021-11-13 NOTE — Discharge Instructions (Addendum)
You had some abnormal findings on your CT neck.  I copy and pasted the results below as you are able to refer to it.  Information is provided above for an alternative primary care doctor he was unable to see you before March.  I do think it is worth calling them and letting them know about the findings to see if they have any additional recommendations.  I think it is likely that the lymph nodes are reactionary, however we cannot rule out any lymphoproliferative disorder.  For that reason information provided above for oncology.  Please call them to set up an appointment.  We will also need to have an ultrasound done of your thyroid, this should be done through your primary care doctor.  IMPRESSION:  1. Increased number of lymph nodes throughout the neck bilaterally,  many of which are upper limits of normal in size. Findings are  nonspecific and could potentially be reactive or secondary to  underlying lymphoproliferative disorder.  2. Mildly prominent tonsillar tissue, amenable to direct inspection.  3. Approximately 1.2 cm inferior right thyroid nodule. Recommend  thyroid US (ref: J Am Coll Radiol. 2015 Feb;12(2): 143-50).

## 2021-12-03 ENCOUNTER — Ambulatory Visit: Payer: Self-pay | Admitting: Nurse Practitioner

## 2021-12-12 ENCOUNTER — Encounter (HOSPITAL_COMMUNITY): Payer: Self-pay | Admitting: Radiology

## 2022-07-29 ENCOUNTER — Ambulatory Visit: Payer: Medicaid Other | Admitting: Physician Assistant

## 2022-07-29 ENCOUNTER — Encounter: Payer: Self-pay | Admitting: Physician Assistant

## 2022-07-29 VITALS — BP 139/96 | HR 95 | Ht 61.0 in | Wt 110.0 lb

## 2022-07-29 DIAGNOSIS — J019 Acute sinusitis, unspecified: Secondary | ICD-10-CM | POA: Diagnosis not present

## 2022-07-29 DIAGNOSIS — B9689 Other specified bacterial agents as the cause of diseases classified elsewhere: Secondary | ICD-10-CM

## 2022-07-29 MED ORDER — DOXYCYCLINE HYCLATE 100 MG PO CAPS: 100.0000 mg | ORAL_CAPSULE | Freq: Two times a day (BID) | ORAL | 0 refills | Status: AC

## 2022-07-29 MED ORDER — CETIRIZINE HCL 10 MG PO TABS: 10.0000 mg | ORAL_TABLET | Freq: Every day | ORAL | 11 refills | Status: AC

## 2022-07-29 MED ORDER — FLUTICASONE PROPIONATE 50 MCG/ACT NA SUSP: 2.0000 | Freq: Every day | NASAL | 6 refills | Status: AC

## 2022-07-29 NOTE — Progress Notes (Unsigned)
New Patient Office Visit  Subjective    Patient ID: Gina Lawson, female    DOB: 1991-04-26  Age: 31 y.o. MRN: [id number]  CC: No chief complaint on file.   HPI Gina Lawson presents to establish care Sinus pressure States that it started 3-4 - saline spray and flonase Yellow greenish No fever  Dry cough  No home covid - no sick contacts  No abx    Outpatient Encounter Medications as of 07/29/2022  Medication Sig   lamoTRIgine (LAMICTAL) 150 MG tablet Take 150 mg by mouth 2 (two) times daily.   oxyCODONE (ROXICODONE) 15 MG immediate release tablet Take 15 mg by mouth every 8 (eight) hours as needed.   ALPRAZolam (XANAX) 1 MG tablet Take 0.5-1 mg by mouth every 6 (six) hours as needed.   amphetamine-dextroamphetamine (ADDERALL) 30 MG tablet Take 1 tablet by mouth 2 (two) times daily. (Patient not taking: Reported on 07/29/2022)   aspirin-acetaminophen-caffeine (EXCEDRIN MIGRAINE) 250-250-65 MG tablet Take by mouth.   fluticasone (FLONASE) 50 MCG/ACT nasal spray Place 2 sprays into both nostrils daily.   HYDROcodone-acetaminophen (NORCO/VICODIN) 5-325 MG tablet hydrocodone 5 mg-acetaminophen 325 mg tablet  TK 1 T PO Q 6 H PRF 5 DAYS   lidocaine (XYLOCAINE) 2 % solution Use as directed 10 mLs in the mouth or throat as needed for mouth pain.   predniSONE (DELTASONE) 20 MG tablet One tab with breakfast and one tab with lunch   predniSONE (DELTASONE) 20 MG tablet Take 2 tablets (40 mg total) by mouth daily with breakfast.   VIENVA 0.1-20 MG-MCG tablet Take 1 tablet by mouth daily.   No facility-administered encounter medications on file as of 07/29/2022.    Past Medical History:  Diagnosis Date   Anxiety    Cancer ([place])    Fibromyalgia    Herniated disc    Hypertension    Migraines    Vitamin D deficiency     Past Surgical History:  Procedure Laterality Date   APPENDECTOMY     CHOLECYSTECTOMY     HYSTEROSCOPY N/A 10/27/2016   Procedure: HYSTEROSCOPY, DILATATION AND  CURETTAGE;  Surgeon: Sanjuana Kava, MD;  Location: [facility];  Service: Gynecology;  Laterality: N/A;   LAPAROSCOPY N/A 10/27/2016   Procedure: DIAGNOSTIC LAPAROSCOPY;  Surgeon: Sanjuana Kava, MD;  Location: [facility];  Service: Gynecology;  Laterality: N/A;    Family History  Family history unknown: Yes    Social History   Socioeconomic History   Marital status: Single    Spouse name: Not on file   Number of children: Not on file   Years of education: Not on file   Highest education level: Not on file  Occupational History   Not on file  Tobacco Use   Smoking status: Former    Packs/day: 0.50    Years: 12.00    Total pack years: 6.00    Types: Cigarettes   Smokeless tobacco: Never  Vaping Use   Vaping Use: Never used  Substance and Sexual Activity   Alcohol use: Yes    Comment: occ   Drug use: No   Sexual activity: Yes    Birth control/protection: None  Other Topics Concern   Not on file  Social History Narrative   Not on file   Social Determinants of Health   Financial Resource Strain: Not on file  Food Insecurity: Not on file  Transportation Needs: Not on file  Physical Activity: Not on file  Stress: Not on  file  Social Connections: Not on file  Intimate Partner Violence: Not on file    ROS      Objective    Ht '5\' 1"'$  (1.549 m)   Wt 110 lb (49.9 kg)   BMI 20.78 kg/m   Physical Exam  {Labs (Optional):23779}    Assessment & Plan:   Problem List Items Addressed This Visit   None   No follow-ups on file.   Loraine Grip Mayers, PA-C

## 2022-07-30 ENCOUNTER — Encounter: Payer: Self-pay | Admitting: Physician Assistant

## 2022-07-30 NOTE — Patient Instructions (Signed)
You are going to take doxycycline twice a day for 10 days, I encourage you to start using Zyrtec on a daily basis and continue using the Flonase.  I encourage you to drink lots of water, and get plenty of rest.  I hope that you feel better soon, please let us know if there is anything else we can do for you.  Kennieth Rad, PA-C Physician Assistant [facility] [email or web address]   Sinus Infection, Adult A sinus infection, also called sinusitis, is inflammation of your sinuses. Sinuses are hollow spaces in the bones around your face. Your sinuses are located: Around your eyes. In the middle of your forehead. Behind your nose. In your cheekbones. Mucus normally drains out of your sinuses. When your nasal tissues become inflamed or swollen, mucus can become trapped or blocked. This allows bacteria, viruses, and fungi to grow, which leads to infection. Most infections of the sinuses are caused by a virus. A sinus infection can develop quickly. It can last for up to 4 weeks (acute) or for more than 12 weeks (chronic). A sinus infection often develops after a cold. What are the causes? This condition is caused by anything that creates swelling in the sinuses or stops mucus from draining. This includes: Allergies. Asthma. Infection from bacteria or viruses. Deformities or blockages in your nose or sinuses. Abnormal growths in the nose (nasal polyps). Pollutants, such as chemicals or irritants in the air. Infection from fungi. This is rare. What increases the risk? You are more likely to develop this condition if you: Have a weak body defense system (immune system). Do a lot of swimming or diving. Overuse nasal sprays. Smoke. What are the signs or symptoms? The main symptoms of this condition are pain and a feeling of pressure around the affected sinuses. Other symptoms include: Stuffy nose or congestion that makes it difficult to  breathe through your nose. Thick yellow or greenish drainage from your nose. Tenderness, swelling, and warmth over the affected sinuses. A cough that may get worse at night. Decreased sense of smell and taste. Extra mucus that collects in the throat or the back of the nose (postnasal drip) causing a sore throat or bad breath. Tiredness (fatigue). Fever. How is this diagnosed? This condition is diagnosed based on: Your symptoms. Your medical history. A physical exam. Tests to find out if your condition is acute or chronic. This may include: Checking your nose for nasal polyps. Viewing your sinuses using a device that has a light (endoscope). Testing for allergies or bacteria. Imaging tests, such as an MRI or CT scan. In rare cases, a bone biopsy may be done to rule out more serious types of fungal sinus disease. How is this treated? Treatment for a sinus infection depends on the cause and whether your condition is chronic or acute. If caused by a virus, your symptoms should go away on their own within 10 days. You may be given medicines to relieve symptoms. They include: Medicines that shrink swollen nasal passages (decongestants). A spray that eases inflammation of the nostrils (topical intranasal corticosteroids). Rinses that help get rid of thick mucus in your nose (nasal saline washes). Medicines that treat allergies (antihistamines). Over-the-counter pain relievers. If caused by bacteria, your health care provider may recommend waiting to see if your symptoms improve. Most bacterial infections will get better without antibiotic medicine. You may be given antibiotics if you have: A severe infection. A weak immune system. If caused by narrow nasal passages or nasal  polyps, surgery may be needed. Follow these instructions at home: Medicines Take, use, or apply over-the-counter and prescription medicines only as told by your health care provider. These may include nasal sprays. If  you were prescribed an antibiotic medicine, take it as told by your health care provider. Do not stop taking the antibiotic even if you start to feel better. Hydrate and humidify  Drink enough fluid to keep your urine pale yellow. Staying hydrated will help to thin your mucus. Use a cool mist humidifier to keep the humidity level in your home above 50%. Inhale steam for 10-15 minutes, 3-4 times a day, or as told by your health care provider. You can do this in the bathroom while a hot shower is running. Limit your exposure to cool or dry air. Rest Rest as much as possible. Sleep with your head raised (elevated). Make sure you get enough sleep each night. General instructions  Apply a warm, moist washcloth to your face 3-4 times a day or as told by your health care provider. This will help with discomfort. Use nasal saline washes as often as told by your health care provider. Wash your hands often with soap and water to reduce your exposure to germs. If soap and water are not available, use hand sanitizer. Do not smoke. Avoid being around people who are smoking (secondhand smoke). Keep all follow-up visits. This is important. Contact a health care provider if: You have a fever. Your symptoms get worse. Your symptoms do not improve within 10 days. Get help right away if: You have a severe headache. You have persistent vomiting. You have severe pain or swelling around your face or eyes. You have vision problems. You develop confusion. Your neck is stiff. You have trouble breathing. These symptoms may be an emergency. Get help right away. Call 911. Do not wait to see if the symptoms will go away. Do not drive yourself to the hospital. Summary A sinus infection is soreness and inflammation of your sinuses. Sinuses are hollow spaces in the bones around your face. This condition is caused by nasal tissues that become inflamed or swollen. The swelling traps or blocks the flow of mucus.  This allows bacteria, viruses, and fungi to grow, which leads to infection. If you were prescribed an antibiotic medicine, take it as told by your health care provider. Do not stop taking the antibiotic even if you start to feel better. Keep all follow-up visits. This is important. This information is not intended to replace advice given to you by your health care provider. Make sure you discuss any questions you have with your health care provider. Document Revised: 10/01/2021 Document Reviewed: 10/01/2021 Elsevier Patient Education  [employer].

## 2023-10-23 ENCOUNTER — Other Ambulatory Visit: Payer: Self-pay | Admitting: Physician Assistant

## 2023-10-23 DIAGNOSIS — B9689 Other specified bacterial agents as the cause of diseases classified elsewhere: Secondary | ICD-10-CM

## 2023-10-26 NOTE — Telephone Encounter (Signed)
Requested Prescriptions  Pending Prescriptions Disp Refills   cetirizine (ZYRTEC) 10 MG tablet [Pharmacy Med Name: CETIRIZINE 10MG  TABLETS] 30 tablet 11    Sig: TAKE 1 TABLET(10 MG) BY MOUTH DAILY     There is no refill protocol information for this order
